# Patient Record
Sex: Female | Born: 1950 | Race: White | Hispanic: No | Marital: Married | Smoking: Never smoker
Health system: Southern US, Community
[De-identification: ages and names within clinical notes are randomized; demographics above are authoritative.]

## PROBLEM LIST (undated history)

## (undated) DIAGNOSIS — Z8719 Personal history of other diseases of the digestive system: Secondary | ICD-10-CM

## (undated) DIAGNOSIS — F419 Anxiety disorder, unspecified: Secondary | ICD-10-CM

## (undated) DIAGNOSIS — F40240 Claustrophobia: Secondary | ICD-10-CM

## (undated) DIAGNOSIS — E039 Hypothyroidism, unspecified: Secondary | ICD-10-CM

## (undated) DIAGNOSIS — D649 Anemia, unspecified: Secondary | ICD-10-CM

## (undated) DIAGNOSIS — I10 Essential (primary) hypertension: Secondary | ICD-10-CM

## (undated) DIAGNOSIS — M1990 Unspecified osteoarthritis, unspecified site: Secondary | ICD-10-CM

## (undated) DIAGNOSIS — C210 Malignant neoplasm of anus, unspecified: Secondary | ICD-10-CM

## (undated) HISTORY — PX: DIAGNOSTIC LAPAROSCOPY: SUR761

## (undated) HISTORY — PX: FRACTURE SURGERY: SHX138

## (undated) HISTORY — PX: TONSILLECTOMY: SUR1361

## (undated) HISTORY — PX: OPEN REDUCTION NASAL FRACTURE: SHX2105

## (undated) HISTORY — DX: Malignant neoplasm of anus, unspecified: C21.0

---

## 2007-08-17 ENCOUNTER — Encounter: Admission: RE | Admit: 2007-08-17 | Discharge: 2007-08-17 | Payer: Self-pay | Admitting: Obstetrics and Gynecology

## 2008-08-28 ENCOUNTER — Encounter: Admission: RE | Admit: 2008-08-28 | Discharge: 2008-08-28 | Payer: Self-pay | Admitting: Obstetrics and Gynecology

## 2010-08-28 ENCOUNTER — Encounter
Admission: RE | Admit: 2010-08-28 | Discharge: 2010-08-28 | Payer: Self-pay | Source: Home / Self Care | Attending: Obstetrics and Gynecology | Admitting: Obstetrics and Gynecology

## 2010-09-20 ENCOUNTER — Encounter: Payer: Self-pay | Admitting: Obstetrics and Gynecology

## 2011-08-20 ENCOUNTER — Other Ambulatory Visit: Payer: Self-pay | Admitting: Obstetrics and Gynecology

## 2011-08-20 DIAGNOSIS — Z1231 Encounter for screening mammogram for malignant neoplasm of breast: Secondary | ICD-10-CM

## 2011-11-22 ENCOUNTER — Ambulatory Visit
Admission: RE | Admit: 2011-11-22 | Discharge: 2011-11-22 | Disposition: A | Discharge status: Home / Self Care | Payer: Medicare Other | Source: Ambulatory Visit | Attending: Obstetrics and Gynecology | Admitting: Obstetrics and Gynecology

## 2011-11-22 DIAGNOSIS — Z1231 Encounter for screening mammogram for malignant neoplasm of breast: Secondary | ICD-10-CM

## 2013-01-10 ENCOUNTER — Other Ambulatory Visit: Payer: Self-pay

## 2013-01-10 DIAGNOSIS — Z1231 Encounter for screening mammogram for malignant neoplasm of breast: Secondary | ICD-10-CM

## 2013-02-14 ENCOUNTER — Ambulatory Visit
Admission: RE | Admit: 2013-02-14 | Discharge: 2013-02-14 | Disposition: A | Discharge status: Home / Self Care | Payer: Medicare Other | Source: Ambulatory Visit

## 2013-02-14 DIAGNOSIS — Z1231 Encounter for screening mammogram for malignant neoplasm of breast: Secondary | ICD-10-CM

## 2014-06-21 ENCOUNTER — Other Ambulatory Visit: Payer: Self-pay

## 2014-06-21 DIAGNOSIS — Z1231 Encounter for screening mammogram for malignant neoplasm of breast: Secondary | ICD-10-CM

## 2014-07-11 ENCOUNTER — Encounter (INDEPENDENT_AMBULATORY_CARE_PROVIDER_SITE_OTHER): Payer: Self-pay

## 2014-07-11 ENCOUNTER — Ambulatory Visit
Admission: RE | Admit: 2014-07-11 | Discharge: 2014-07-11 | Disposition: A | Discharge status: Home / Self Care | Payer: Medicare Other | Source: Ambulatory Visit

## 2014-07-11 DIAGNOSIS — Z1231 Encounter for screening mammogram for malignant neoplasm of breast: Secondary | ICD-10-CM

## 2015-07-16 ENCOUNTER — Other Ambulatory Visit: Payer: Self-pay

## 2015-07-16 DIAGNOSIS — Z1231 Encounter for screening mammogram for malignant neoplasm of breast: Secondary | ICD-10-CM

## 2015-11-03 DIAGNOSIS — M25562 Pain in left knee: Secondary | ICD-10-CM | POA: Diagnosis not present

## 2015-11-14 DIAGNOSIS — Z888 Allergy status to other drugs, medicaments and biological substances status: Secondary | ICD-10-CM | POA: Diagnosis not present

## 2015-11-14 DIAGNOSIS — M25562 Pain in left knee: Secondary | ICD-10-CM | POA: Diagnosis not present

## 2015-11-14 DIAGNOSIS — M7732 Calcaneal spur, left foot: Secondary | ICD-10-CM | POA: Diagnosis not present

## 2015-11-14 DIAGNOSIS — I10 Essential (primary) hypertension: Secondary | ICD-10-CM | POA: Diagnosis not present

## 2015-11-14 DIAGNOSIS — Z79899 Other long term (current) drug therapy: Secondary | ICD-10-CM | POA: Diagnosis not present

## 2015-11-14 DIAGNOSIS — Z7982 Long term (current) use of aspirin: Secondary | ICD-10-CM | POA: Diagnosis not present

## 2015-11-14 DIAGNOSIS — T84194A Other mechanical complication of internal fixation device of right femur, initial encounter: Secondary | ICD-10-CM | POA: Diagnosis not present

## 2015-11-14 DIAGNOSIS — E039 Hypothyroidism, unspecified: Secondary | ICD-10-CM | POA: Diagnosis not present

## 2015-11-14 DIAGNOSIS — Z8249 Family history of ischemic heart disease and other diseases of the circulatory system: Secondary | ICD-10-CM | POA: Diagnosis not present

## 2015-11-14 DIAGNOSIS — Z833 Family history of diabetes mellitus: Secondary | ICD-10-CM | POA: Diagnosis not present

## 2015-11-14 DIAGNOSIS — M1990 Unspecified osteoarthritis, unspecified site: Secondary | ICD-10-CM | POA: Diagnosis not present

## 2015-11-14 DIAGNOSIS — Y838 Other surgical procedures as the cause of abnormal reaction of the patient, or of later complication, without mention of misadventure at the time of the procedure: Secondary | ICD-10-CM | POA: Diagnosis not present

## 2015-11-14 DIAGNOSIS — Z8261 Family history of arthritis: Secondary | ICD-10-CM | POA: Diagnosis not present

## 2015-11-14 DIAGNOSIS — T8484XA Pain due to internal orthopedic prosthetic devices, implants and grafts, initial encounter: Secondary | ICD-10-CM | POA: Diagnosis not present

## 2015-11-17 DIAGNOSIS — I10 Essential (primary) hypertension: Secondary | ICD-10-CM | POA: Diagnosis not present

## 2015-11-17 DIAGNOSIS — M25562 Pain in left knee: Secondary | ICD-10-CM | POA: Diagnosis not present

## 2015-11-17 DIAGNOSIS — Z79899 Other long term (current) drug therapy: Secondary | ICD-10-CM | POA: Diagnosis not present

## 2015-11-17 DIAGNOSIS — T8484XA Pain due to internal orthopedic prosthetic devices, implants and grafts, initial encounter: Secondary | ICD-10-CM | POA: Diagnosis not present

## 2015-11-17 DIAGNOSIS — Z7982 Long term (current) use of aspirin: Secondary | ICD-10-CM | POA: Diagnosis not present

## 2015-11-17 DIAGNOSIS — E039 Hypothyroidism, unspecified: Secondary | ICD-10-CM | POA: Diagnosis not present

## 2015-12-10 DIAGNOSIS — M25562 Pain in left knee: Secondary | ICD-10-CM | POA: Diagnosis not present

## 2015-12-10 DIAGNOSIS — T84194A Other mechanical complication of internal fixation device of right femur, initial encounter: Secondary | ICD-10-CM | POA: Diagnosis not present

## 2015-12-16 DIAGNOSIS — M25562 Pain in left knee: Secondary | ICD-10-CM | POA: Diagnosis not present

## 2015-12-16 DIAGNOSIS — R262 Difficulty in walking, not elsewhere classified: Secondary | ICD-10-CM | POA: Diagnosis not present

## 2015-12-19 DIAGNOSIS — M25562 Pain in left knee: Secondary | ICD-10-CM | POA: Diagnosis not present

## 2015-12-19 DIAGNOSIS — R262 Difficulty in walking, not elsewhere classified: Secondary | ICD-10-CM | POA: Diagnosis not present

## 2015-12-24 DIAGNOSIS — M25562 Pain in left knee: Secondary | ICD-10-CM | POA: Diagnosis not present

## 2015-12-24 DIAGNOSIS — R262 Difficulty in walking, not elsewhere classified: Secondary | ICD-10-CM | POA: Diagnosis not present

## 2015-12-26 DIAGNOSIS — M25562 Pain in left knee: Secondary | ICD-10-CM | POA: Diagnosis not present

## 2015-12-26 DIAGNOSIS — R262 Difficulty in walking, not elsewhere classified: Secondary | ICD-10-CM | POA: Diagnosis not present

## 2015-12-31 DIAGNOSIS — M25562 Pain in left knee: Secondary | ICD-10-CM | POA: Diagnosis not present

## 2015-12-31 DIAGNOSIS — R262 Difficulty in walking, not elsewhere classified: Secondary | ICD-10-CM | POA: Diagnosis not present

## 2016-01-02 DIAGNOSIS — M25562 Pain in left knee: Secondary | ICD-10-CM | POA: Diagnosis not present

## 2016-01-02 DIAGNOSIS — R262 Difficulty in walking, not elsewhere classified: Secondary | ICD-10-CM | POA: Diagnosis not present

## 2016-01-07 DIAGNOSIS — M25562 Pain in left knee: Secondary | ICD-10-CM | POA: Diagnosis not present

## 2016-01-07 DIAGNOSIS — R262 Difficulty in walking, not elsewhere classified: Secondary | ICD-10-CM | POA: Diagnosis not present

## 2016-01-09 DIAGNOSIS — R262 Difficulty in walking, not elsewhere classified: Secondary | ICD-10-CM | POA: Diagnosis not present

## 2016-01-09 DIAGNOSIS — M25562 Pain in left knee: Secondary | ICD-10-CM | POA: Diagnosis not present

## 2016-01-13 DIAGNOSIS — I10 Essential (primary) hypertension: Secondary | ICD-10-CM | POA: Diagnosis not present

## 2016-01-13 DIAGNOSIS — E039 Hypothyroidism, unspecified: Secondary | ICD-10-CM | POA: Diagnosis not present

## 2016-01-13 DIAGNOSIS — Z789 Other specified health status: Secondary | ICD-10-CM | POA: Diagnosis not present

## 2016-01-13 DIAGNOSIS — E785 Hyperlipidemia, unspecified: Secondary | ICD-10-CM | POA: Diagnosis not present

## 2016-01-13 DIAGNOSIS — Z299 Encounter for prophylactic measures, unspecified: Secondary | ICD-10-CM | POA: Diagnosis not present

## 2016-01-14 DIAGNOSIS — E039 Hypothyroidism, unspecified: Secondary | ICD-10-CM | POA: Diagnosis not present

## 2016-01-14 DIAGNOSIS — E785 Hyperlipidemia, unspecified: Secondary | ICD-10-CM | POA: Diagnosis not present

## 2016-01-14 DIAGNOSIS — R262 Difficulty in walking, not elsewhere classified: Secondary | ICD-10-CM | POA: Diagnosis not present

## 2016-01-14 DIAGNOSIS — I10 Essential (primary) hypertension: Secondary | ICD-10-CM | POA: Diagnosis not present

## 2016-01-14 DIAGNOSIS — M25562 Pain in left knee: Secondary | ICD-10-CM | POA: Diagnosis not present

## 2016-01-16 DIAGNOSIS — R262 Difficulty in walking, not elsewhere classified: Secondary | ICD-10-CM | POA: Diagnosis not present

## 2016-01-16 DIAGNOSIS — M25562 Pain in left knee: Secondary | ICD-10-CM | POA: Diagnosis not present

## 2016-01-21 DIAGNOSIS — Z299 Encounter for prophylactic measures, unspecified: Secondary | ICD-10-CM | POA: Diagnosis not present

## 2016-01-21 DIAGNOSIS — I10 Essential (primary) hypertension: Secondary | ICD-10-CM | POA: Diagnosis not present

## 2016-01-21 DIAGNOSIS — E785 Hyperlipidemia, unspecified: Secondary | ICD-10-CM | POA: Diagnosis not present

## 2016-01-21 DIAGNOSIS — E039 Hypothyroidism, unspecified: Secondary | ICD-10-CM | POA: Diagnosis not present

## 2016-01-23 DIAGNOSIS — R262 Difficulty in walking, not elsewhere classified: Secondary | ICD-10-CM | POA: Diagnosis not present

## 2016-01-23 DIAGNOSIS — M25562 Pain in left knee: Secondary | ICD-10-CM | POA: Diagnosis not present

## 2016-02-06 DIAGNOSIS — R262 Difficulty in walking, not elsewhere classified: Secondary | ICD-10-CM | POA: Diagnosis not present

## 2016-02-06 DIAGNOSIS — M25562 Pain in left knee: Secondary | ICD-10-CM | POA: Diagnosis not present

## 2016-02-11 DIAGNOSIS — M25562 Pain in left knee: Secondary | ICD-10-CM | POA: Diagnosis not present

## 2016-02-11 DIAGNOSIS — R262 Difficulty in walking, not elsewhere classified: Secondary | ICD-10-CM | POA: Diagnosis not present

## 2016-02-13 DIAGNOSIS — M25562 Pain in left knee: Secondary | ICD-10-CM | POA: Diagnosis not present

## 2016-02-13 DIAGNOSIS — R262 Difficulty in walking, not elsewhere classified: Secondary | ICD-10-CM | POA: Diagnosis not present

## 2016-02-18 DIAGNOSIS — R262 Difficulty in walking, not elsewhere classified: Secondary | ICD-10-CM | POA: Diagnosis not present

## 2016-02-18 DIAGNOSIS — M25562 Pain in left knee: Secondary | ICD-10-CM | POA: Diagnosis not present

## 2016-02-20 DIAGNOSIS — R262 Difficulty in walking, not elsewhere classified: Secondary | ICD-10-CM | POA: Diagnosis not present

## 2016-02-20 DIAGNOSIS — M25562 Pain in left knee: Secondary | ICD-10-CM | POA: Diagnosis not present

## 2016-04-01 DIAGNOSIS — N952 Postmenopausal atrophic vaginitis: Secondary | ICD-10-CM | POA: Diagnosis not present

## 2016-04-01 DIAGNOSIS — Z1212 Encounter for screening for malignant neoplasm of rectum: Secondary | ICD-10-CM | POA: Diagnosis not present

## 2016-04-01 DIAGNOSIS — K648 Other hemorrhoids: Secondary | ICD-10-CM | POA: Diagnosis not present

## 2016-05-24 DIAGNOSIS — M172 Bilateral post-traumatic osteoarthritis of knee: Secondary | ICD-10-CM | POA: Diagnosis not present

## 2016-05-24 DIAGNOSIS — M1712 Unilateral primary osteoarthritis, left knee: Secondary | ICD-10-CM | POA: Diagnosis not present

## 2016-05-24 DIAGNOSIS — M25561 Pain in right knee: Secondary | ICD-10-CM | POA: Diagnosis not present

## 2016-05-24 DIAGNOSIS — M25562 Pain in left knee: Secondary | ICD-10-CM | POA: Diagnosis not present

## 2016-06-03 DIAGNOSIS — E7800 Pure hypercholesterolemia, unspecified: Secondary | ICD-10-CM | POA: Diagnosis not present

## 2016-06-03 DIAGNOSIS — Z1211 Encounter for screening for malignant neoplasm of colon: Secondary | ICD-10-CM | POA: Diagnosis not present

## 2016-06-03 DIAGNOSIS — Z7189 Other specified counseling: Secondary | ICD-10-CM | POA: Diagnosis not present

## 2016-06-03 DIAGNOSIS — Z299 Encounter for prophylactic measures, unspecified: Secondary | ICD-10-CM | POA: Diagnosis not present

## 2016-06-03 DIAGNOSIS — Z0000 Encounter for general adult medical examination without abnormal findings: Secondary | ICD-10-CM | POA: Diagnosis not present

## 2016-06-03 DIAGNOSIS — E039 Hypothyroidism, unspecified: Secondary | ICD-10-CM | POA: Diagnosis not present

## 2016-06-03 DIAGNOSIS — Z1389 Encounter for screening for other disorder: Secondary | ICD-10-CM | POA: Diagnosis not present

## 2016-06-03 DIAGNOSIS — Z79899 Other long term (current) drug therapy: Secondary | ICD-10-CM | POA: Diagnosis not present

## 2016-06-03 DIAGNOSIS — R5383 Other fatigue: Secondary | ICD-10-CM | POA: Diagnosis not present

## 2016-06-17 DIAGNOSIS — M172 Bilateral post-traumatic osteoarthritis of knee: Secondary | ICD-10-CM | POA: Diagnosis not present

## 2016-06-18 ENCOUNTER — Other Ambulatory Visit: Payer: Self-pay | Admitting: Obstetrics and Gynecology

## 2016-06-18 DIAGNOSIS — Z1231 Encounter for screening mammogram for malignant neoplasm of breast: Secondary | ICD-10-CM

## 2016-07-07 ENCOUNTER — Ambulatory Visit: Payer: Self-pay

## 2016-07-13 ENCOUNTER — Ambulatory Visit
Admission: RE | Admit: 2016-07-13 | Discharge: 2016-07-13 | Disposition: A | Discharge status: Home / Self Care | Payer: Medicare Other | Source: Ambulatory Visit | Attending: Obstetrics and Gynecology | Admitting: Obstetrics and Gynecology

## 2016-07-13 DIAGNOSIS — Z1231 Encounter for screening mammogram for malignant neoplasm of breast: Secondary | ICD-10-CM

## 2016-08-18 DIAGNOSIS — L821 Other seborrheic keratosis: Secondary | ICD-10-CM | POA: Diagnosis not present

## 2016-08-18 DIAGNOSIS — L7211 Pilar cyst: Secondary | ICD-10-CM | POA: Diagnosis not present

## 2016-08-18 DIAGNOSIS — L820 Inflamed seborrheic keratosis: Secondary | ICD-10-CM | POA: Diagnosis not present

## 2016-08-18 DIAGNOSIS — L818 Other specified disorders of pigmentation: Secondary | ICD-10-CM | POA: Diagnosis not present

## 2016-09-07 DIAGNOSIS — E039 Hypothyroidism, unspecified: Secondary | ICD-10-CM | POA: Diagnosis not present

## 2016-09-17 DIAGNOSIS — M1711 Unilateral primary osteoarthritis, right knee: Secondary | ICD-10-CM | POA: Diagnosis not present

## 2016-09-17 DIAGNOSIS — M170 Bilateral primary osteoarthritis of knee: Secondary | ICD-10-CM | POA: Diagnosis not present

## 2016-09-17 DIAGNOSIS — M1712 Unilateral primary osteoarthritis, left knee: Secondary | ICD-10-CM | POA: Diagnosis not present

## 2016-11-04 DIAGNOSIS — Z789 Other specified health status: Secondary | ICD-10-CM | POA: Diagnosis not present

## 2016-11-04 DIAGNOSIS — E785 Hyperlipidemia, unspecified: Secondary | ICD-10-CM | POA: Diagnosis not present

## 2016-11-04 DIAGNOSIS — Z299 Encounter for prophylactic measures, unspecified: Secondary | ICD-10-CM | POA: Diagnosis not present

## 2016-11-04 DIAGNOSIS — Z713 Dietary counseling and surveillance: Secondary | ICD-10-CM | POA: Diagnosis not present

## 2016-11-04 DIAGNOSIS — E039 Hypothyroidism, unspecified: Secondary | ICD-10-CM | POA: Diagnosis not present

## 2016-11-04 DIAGNOSIS — Z6834 Body mass index (BMI) 34.0-34.9, adult: Secondary | ICD-10-CM | POA: Diagnosis not present

## 2016-11-04 DIAGNOSIS — R109 Unspecified abdominal pain: Secondary | ICD-10-CM | POA: Diagnosis not present

## 2017-01-07 DIAGNOSIS — Z789 Other specified health status: Secondary | ICD-10-CM | POA: Diagnosis not present

## 2017-01-07 DIAGNOSIS — R1011 Right upper quadrant pain: Secondary | ICD-10-CM | POA: Diagnosis not present

## 2017-01-07 DIAGNOSIS — Z299 Encounter for prophylactic measures, unspecified: Secondary | ICD-10-CM | POA: Diagnosis not present

## 2017-01-07 DIAGNOSIS — Z6834 Body mass index (BMI) 34.0-34.9, adult: Secondary | ICD-10-CM | POA: Diagnosis not present

## 2017-01-07 DIAGNOSIS — E039 Hypothyroidism, unspecified: Secondary | ICD-10-CM | POA: Diagnosis not present

## 2017-01-07 DIAGNOSIS — J329 Chronic sinusitis, unspecified: Secondary | ICD-10-CM | POA: Diagnosis not present

## 2017-01-10 NOTE — Progress Notes (Signed)
Please place orders in EPIC as patient is being scheduled for a pre-op appointment! Thank you! 

## 2017-01-14 ENCOUNTER — Ambulatory Visit: Payer: Self-pay | Admitting: Orthopedic Surgery

## 2017-01-17 DIAGNOSIS — K8020 Calculus of gallbladder without cholecystitis without obstruction: Secondary | ICD-10-CM | POA: Diagnosis not present

## 2017-01-17 DIAGNOSIS — R1010 Upper abdominal pain, unspecified: Secondary | ICD-10-CM | POA: Diagnosis not present

## 2017-01-21 DIAGNOSIS — Z6833 Body mass index (BMI) 33.0-33.9, adult: Secondary | ICD-10-CM | POA: Diagnosis not present

## 2017-01-21 DIAGNOSIS — Z299 Encounter for prophylactic measures, unspecified: Secondary | ICD-10-CM | POA: Diagnosis not present

## 2017-01-21 DIAGNOSIS — E039 Hypothyroidism, unspecified: Secondary | ICD-10-CM | POA: Diagnosis not present

## 2017-01-21 DIAGNOSIS — I10 Essential (primary) hypertension: Secondary | ICD-10-CM | POA: Diagnosis not present

## 2017-01-21 DIAGNOSIS — R109 Unspecified abdominal pain: Secondary | ICD-10-CM | POA: Diagnosis not present

## 2017-01-21 DIAGNOSIS — E785 Hyperlipidemia, unspecified: Secondary | ICD-10-CM | POA: Diagnosis not present

## 2017-02-01 ENCOUNTER — Encounter: Payer: Self-pay | Admitting: General Surgery

## 2017-02-01 ENCOUNTER — Ambulatory Visit (INDEPENDENT_AMBULATORY_CARE_PROVIDER_SITE_OTHER): Payer: Medicare Other | Admitting: General Surgery

## 2017-02-01 VITALS — BP 162/81 | HR 81 | Temp 97.8°F | Resp 18 | Ht 64.0 in | Wt 194.0 lb

## 2017-02-01 DIAGNOSIS — K8020 Calculus of gallbladder without cholecystitis without obstruction: Secondary | ICD-10-CM | POA: Diagnosis not present

## 2017-02-01 NOTE — Patient Instructions (Signed)
[name]  02/01/2017     @PREFPERIOPPHARMACY @   Your procedure is scheduled on  02/04/2017   Report to [facility] at  615  A.M.  Call this number if you have problems the morning of surgery:  [phone]   Remember:  Do not eat food or drink liquids after midnight.  Take these medicines the morning of surgery with A SIP OF WATER  None   Do not wear jewelry, make-up or nail polish.  Do not wear lotions, powders, or perfumes, or deoderant.  Do not shave 48 hours prior to surgery.  Men may shave face and neck.  Do not bring valuables to the hospital.  [facility] is not responsible for any belongings or valuables.  Contacts, dentures or bridgework may not be worn into surgery.  Leave your suitcase in the car.  After surgery it may be brought to your room.  For patients admitted to the hospital, discharge time will be determined by your treatment team.  Patients discharged the day of surgery will not be allowed to drive home.   Name and phone number of your driver:   family Special instructions:  None  Please read over the following fact sheets that you were given. Anesthesia Post-op Instructions and Care and Recovery After Surgery       Laparoscopic Cholecystectomy Laparoscopic cholecystectomy is surgery to remove the gallbladder. The gallbladder is a pear-shaped organ that lies beneath the liver on the right side of the body. The gallbladder stores bile, which is a fluid that helps the body to digest fats. Cholecystectomy is often done for inflammation of the gallbladder (cholecystitis). This condition is usually caused by a buildup of gallstones (cholelithiasis) in the gallbladder. Gallstones can block the flow of bile, which can result in inflammation and pain. In severe cases, emergency surgery may be required. This procedure is done though small incisions in your abdomen (laparoscopic surgery). A thin scope with a camera (laparoscope) is inserted  through one incision. Thin surgical instruments are inserted through the other incisions. In some cases, a laparoscopic procedure may be turned into a type of surgery that is done through a larger incision (open surgery). Tell a health care provider about:  Any allergies you have.  All medicines you are taking, including vitamins, herbs, eye drops, creams, and over-the-counter medicines.  Any problems you or family members have had with anesthetic medicines.  Any blood disorders you have.  Any surgeries you have had.  Any medical conditions you have.  Whether you are pregnant or may be pregnant. What are the risks? Generally, this is a safe procedure. However, problems may occur, including:  Infection.  Bleeding.  Allergic reactions to medicines.  Damage to other structures or organs.  A stone remaining in the common bile duct. The common bile duct carries bile from the gallbladder into the small intestine.  A bile leak from the cyst duct that is clipped when your gallbladder is removed.  What happens before the procedure? Staying hydrated Follow instructions from your health care provider about hydration, which may include:  Up to 2 hours before the procedure - you may continue to drink clear liquids, such as water, clear fruit juice, black coffee, and plain tea.  Eating and drinking restrictions Follow instructions from your health care provider about eating and drinking, which may include:  8 hours before the procedure - stop eating heavy meals or foods such as  meat, fried foods, or fatty foods.  6 hours before the procedure - stop eating light meals or foods, such as toast or cereal.  6 hours before the procedure - stop drinking milk or drinks that contain milk.  2 hours before the procedure - stop drinking clear liquids.  Medicines  Ask your health care provider about: ? Changing or stopping your regular medicines. This is especially important if you are taking  diabetes medicines or blood thinners. ? Taking medicines such as aspirin and ibuprofen. These medicines can thin your blood. Do not take these medicines before your procedure if your health care provider instructs you not to.  You may be given antibiotic medicine to help prevent infection. General instructions  Let your health care provider know if you develop a cold or an infection before surgery.  Plan to have someone take you home from the hospital or clinic.  Ask your health care provider how your surgical site will be marked or identified. What happens during the procedure?  To reduce your risk of infection: ? Your health care team will wash or sanitize their hands. ? Your skin will be washed with soap. ? Hair may be removed from the surgical area.  An IV tube may be inserted into one of your veins.  You will be given one or more of the following: ? A medicine to help you relax (sedative). ? A medicine to make you fall asleep (general anesthetic).  A breathing tube will be placed in your mouth.  Your surgeon will make several small cuts (incisions) in your abdomen.  The laparoscope will be inserted through one of the small incisions. The camera on the laparoscope will send images to a TV screen (monitor) in the operating room. This lets your surgeon see inside your abdomen.  Air-like gas will be pumped into your abdomen. This will expand your abdomen to give the surgeon more room to perform the surgery.  Other tools that are needed for the procedure will be inserted through the other incisions. The gallbladder will be removed through one of the incisions.  Your common bile duct may be examined. If stones are found in the common bile duct, they may be removed.  After your gallbladder has been removed, the incisions will be closed with stitches (sutures), staples, or skin glue.  Your incisions may be covered with a bandage (dressing). The procedure may vary among health care  providers and hospitals. What happens after the procedure?  Your blood pressure, heart rate, breathing rate, and blood oxygen level will be monitored until the medicines you were given have worn off.  You will be given medicines as needed to control your pain.  Do not drive for 24 hours if you were given a sedative. This information is not intended to replace advice given to you by your health care provider. Make sure you discuss any questions you have with your health care provider. Document Released: 08/16/2005 Document Revised: 03/07/2016 Document Reviewed: 02/02/2016 Elsevier Interactive Patient Education  2018 [employer].  Laparoscopic Cholecystectomy, Care After This sheet gives you information about how to care for yourself after your procedure. Your doctor may also give you more specific instructions. If you have problems or questions, contact your doctor. Follow these instructions at home: Care for cuts from surgery (incisions)   Follow instructions from your doctor about how to take care of your cuts from surgery. Make sure you: ? Wash your hands with soap and water before you change your  bandage (dressing). If you cannot use soap and water, use hand sanitizer. ? Change your bandage as told by your doctor. ? Leave stitches (sutures), skin glue, or skin tape (adhesive) strips in place. They may need to stay in place for 2 weeks or longer. If tape strips get loose and curl up, you may trim the loose edges. Do not remove tape strips completely unless your doctor says it is okay.  Do not take baths, swim, or use a hot tub until your doctor says it is okay. Ask your doctor if you can take showers. You may only be allowed to take sponge baths for bathing.  Check your surgical cut area every day for signs of infection. Check for: ? More redness, swelling, or pain. ? More fluid or blood. ? Warmth. ? Pus or a bad smell. Activity  Do not drive or use heavy machinery while taking  prescription pain medicine.  Do not lift anything that is heavier than 10 lb (4.5 kg) until your doctor says it is okay.  Do not play contact sports until your doctor says it is okay.  Do not drive for 24 hours if you were given a medicine to help you relax (sedative).  Rest as needed. Do not return to work or school until your doctor says it is okay. General instructions  Take over-the-counter and prescription medicines only as told by your doctor.  To prevent or treat constipation while you are taking prescription pain medicine, your doctor may recommend that you: ? Drink enough fluid to keep your pee (urine) clear or pale yellow. ? Take over-the-counter or prescription medicines. ? Eat foods that are high in fiber, such as fresh fruits and vegetables, whole grains, and beans. ? Limit foods that are high in fat and processed sugars, such as fried and sweet foods. Contact a doctor if:  You develop a rash.  You have more redness, swelling, or pain around your surgical cuts.  You have more fluid or blood coming from your surgical cuts.  Your surgical cuts feel warm to the touch.  You have pus or a bad smell coming from your surgical cuts.  You have a fever.  One or more of your surgical cuts breaks open. Get help right away if:  You have trouble breathing.  You have chest pain.  You have pain that is getting worse in your shoulders.  You faint or feel dizzy when you stand.  You have very bad pain in your belly (abdomen).  You are sick to your stomach (nauseous) for more than one day.  You have throwing up (vomiting) that lasts for more than one day.  You have leg pain. This information is not intended to replace advice given to you by your health care provider. Make sure you discuss any questions you have with your health care provider. Document Released: 05/25/2008 Document Revised: 03/06/2016 Document Reviewed: 02/02/2016 Elsevier Interactive Patient Education   2017 [place] Anesthesia, Adult General anesthesia is the use of medicines to make a person "go to sleep" (be unconscious) for a medical procedure. General anesthesia is often recommended when a procedure:  Is long.  Requires you to be still or in an unusual position.  Is major and can cause you to lose blood.  Is impossible to do without general anesthesia.  The medicines used for general anesthesia are called general anesthetics. In addition to making you sleep, the medicines:  Prevent pain.  Control your blood pressure.  Relax  your muscles.  Tell a health care provider about:  Any allergies you have.  All medicines you are taking, including vitamins, herbs, eye drops, creams, and over-the-counter medicines.  Any problems you or family members have had with anesthetic medicines.  Types of anesthetics you have had in the past.  Any bleeding disorders you have.  Any surgeries you have had.  Any medical conditions you have.  Any history of heart or lung conditions, such as heart failure, sleep apnea, or chronic obstructive pulmonary disease (COPD).  Whether you are pregnant or may be pregnant.  Whether you use tobacco, alcohol, marijuana, or street drugs.  Any history of Armed forces logistics/support/administrative officer.  Any history of depression or anxiety. What are the risks? Generally, this is a safe procedure. However, problems may occur, including:  Allergic reaction to anesthetics.  Lung and heart problems.  Inhaling food or liquids from your stomach into your lungs (aspiration).  Injury to nerves.  Waking up during your procedure and being unable to move (rare).  Extreme agitation or a state of mental confusion (delirium) when you wake up from the anesthetic.  Air in the bloodstream, which can lead to stroke.  These problems are more likely to develop if you are having a major surgery or if you have an advanced medical condition. You can prevent some of these  complications by answering all of your health care provider's questions thoroughly and by following all pre-procedure instructions. General anesthesia can cause side effects, including:  Nausea or vomiting  A sore throat from the breathing tube.  Feeling cold or shivery.  Feeling tired, washed out, or achy.  Sleepiness or drowsiness.  Confusion or agitation.  What happens before the procedure? Staying hydrated Follow instructions from your health care provider about hydration, which may include:  Up to 2 hours before the procedure - you may continue to drink clear liquids, such as water, clear fruit juice, black coffee, and plain tea.  Eating and drinking restrictions Follow instructions from your health care provider about eating and drinking, which may include:  8 hours before the procedure - stop eating heavy meals or foods such as meat, fried foods, or fatty foods.  6 hours before the procedure - stop eating light meals or foods, such as toast or cereal.  6 hours before the procedure - stop drinking milk or drinks that contain milk.  2 hours before the procedure - stop drinking clear liquids.  Medicines  Ask your health care provider about: ? Changing or stopping your regular medicines. This is especially important if you are taking diabetes medicines or blood thinners. ? Taking medicines such as aspirin and ibuprofen. These medicines can thin your blood. Do not take these medicines before your procedure if your health care provider instructs you not to. ? Taking new dietary supplements or medicines. Do not take these during the week before your procedure unless your health care provider approves them.  If you are told to take a medicine or to continue taking a medicine on the day of the procedure, take the medicine with sips of water. General instructions   Ask if you will be going home the same day, the following day, or after a longer hospital stay. ? Plan to have  someone take you home. ? Plan to have someone stay with you for the first 24 hours after you leave the hospital or clinic.  For 3-6 weeks before the procedure, try not to use any tobacco products, such as cigarettes, chewing  tobacco, and e-cigarettes.  You may brush your teeth on the morning of the procedure, but make sure to spit out the toothpaste. What happens during the procedure?  You will be given anesthetics through a mask and through an IV tube in one of your veins.  You may receive medicine to help you relax (sedative).  As soon as you are asleep, a breathing tube may be used to help you breathe.  An anesthesia specialist will stay with you throughout the procedure. He or she will help keep you comfortable and safe by continuing to give you medicines and adjusting the amount of medicine that you get. He or she will also watch your blood pressure, pulse, and oxygen levels to make sure that the anesthetics do not cause any problems.  If a breathing tube was used to help you breathe, it will be removed before you wake up. The procedure may vary among health care providers and hospitals. What happens after the procedure?  You will wake up, often slowly, after the procedure is complete, usually in a recovery area.  Your blood pressure, heart rate, breathing rate, and blood oxygen level will be monitored until the medicines you were given have worn off.  You may be given medicine to help you calm down if you feel anxious or agitated.  If you will be going home the same day, your health care provider may check to make sure you can stand, drink, and urinate.  Your health care providers will treat your pain and side effects before you go home.  Do not drive for 24 hours if you received a sedative.  You may: ? Feel nauseous and vomit. ? Have a sore throat. ? Have mental slowness. ? Feel cold or shivery. ? Feel sleepy. ? Feel tired. ? Feel sore or achy, even in parts of your body  where you did not have surgery. This information is not intended to replace advice given to you by your health care provider. Make sure you discuss any questions you have with your health care provider. Document Released: 11/23/2007 Document Revised: 01/27/2016 Document Reviewed: 07/31/2015 Elsevier Interactive Patient Education  2018 [place] Anesthesia, Adult, Care After These instructions provide you with information about caring for yourself after your procedure. Your health care provider may also give you more specific instructions. Your treatment has been planned according to current medical practices, but problems sometimes occur. Call your health care provider if you have any problems or questions after your procedure. What can I expect after the procedure? After the procedure, it is common to have:  Vomiting.  A sore throat.  Mental slowness.  It is common to feel:  Nauseous.  Cold or shivery.  Sleepy.  Tired.  Sore or achy, even in parts of your body where you did not have surgery.  Follow these instructions at home: For at least 24 hours after the procedure:  Do not: ? Participate in activities where you could fall or become injured. ? Drive. ? Use heavy machinery. ? Drink alcohol. ? Take sleeping pills or medicines that cause drowsiness. ? Make important decisions or sign legal documents. ? Take care of children on your own.  Rest. Eating and drinking  If you vomit, drink water, juice, or soup when you can drink without vomiting.  Drink enough fluid to keep your urine clear or pale yellow.  Make sure you have little or no nausea before eating solid foods.  Follow the diet recommended by your health  care provider. General instructions  Have a responsible adult stay with you until you are awake and alert.  Return to your normal activities as told by your health care provider. Ask your health care provider what activities are safe for  you.  Take over-the-counter and prescription medicines only as told by your health care provider.  If you smoke, do not smoke without supervision.  Keep all follow-up visits as told by your health care provider. This is important. Contact a health care provider if:  You continue to have nausea or vomiting at home, and medicines are not helpful.  You cannot drink fluids or start eating again.  You cannot urinate after 8-12 hours.  You develop a skin rash.  You have fever.  You have increasing redness at the site of your procedure. Get help right away if:  You have difficulty breathing.  You have chest pain.  You have unexpected bleeding.  You feel that you are having a life-threatening or urgent problem. This information is not intended to replace advice given to you by your health care provider. Make sure you discuss any questions you have with your health care provider. Document Released: 11/22/2000 Document Revised: 01/19/2016 Document Reviewed: 07/31/2015 Elsevier Interactive Patient Education  [employer].

## 2017-02-01 NOTE — Progress Notes (Signed)
[name]; [id number]; 01/20/1951   HPI Patient is a 66 year old white female who presents with worsening right upper quadrant abdominal pain and nausea. She also has had fatty food intolerance. I had seen her in the remote past for cholelithiasis, but she was asymptomatic at that time. She recently had an ultrasound at another facility which revealed a single gallstone near the neck of the gallbladder. The common bile duct within normal limits. The patient referred herself back to my care. She is about to undergo hardware removal in her leg later this month. She states her pain is 8 out of 10. She has been having the pain more frequently and with increased intensity. She is avoiding fatty foods. No fever, chills, jaundice have been noted. History reviewed. No pertinent past medical history.  History reviewed. No pertinent surgical history. Left leg surgery History reviewed. No pertinent family history.  No current outpatient prescriptions on file prior to visit.   No current facility-administered medications on file prior to visit.     No Known Allergies  History  Alcohol use Not on file    History  Smoking Status  . Never Smoker  Smokeless Tobacco  . Never Used    Review of Systems  Constitutional: Positive for malaise/fatigue.  HENT: Positive for ear pain, sinus pain and sore throat.   Eyes: Positive for blurred vision.  Respiratory: Negative.   Cardiovascular: Negative.   Gastrointestinal: Positive for abdominal pain and nausea. Negative for vomiting.  Genitourinary: Negative.   Musculoskeletal: Positive for back pain, joint pain and neck pain.  Skin: Negative.   Neurological: Positive for headaches.  Endo/Heme/Allergies: Negative.   Psychiatric/Behavioral: Negative.     Objective   Vitals:   02/01/17 1126  BP: (!) 162/81  Pulse: 81  Resp: 18  Temp: 97.8 F (36.6 C)    Physical Exam  Constitutional: She is oriented to person, place, and time and  well-developed, well-nourished, and in no distress.  HENT:  Head: Normocephalic and atraumatic.  Neck: Normal range of motion. Neck supple.  Cardiovascular: Normal rate, regular rhythm and normal heart sounds.   No murmur heard. Pulmonary/Chest: Effort normal and breath sounds normal. She has no wheezes. She has no rales.  Abdominal: Soft. She exhibits no distension. There is no rebound.  Slightly tender to palpation in the right upper quadrant. No rigidity noted.  Neurological: She is alert and oriented to person, place, and time.  Skin: Skin is warm and dry.  Vitals reviewed.  Ultrasound report reviewed Assessment  Biliary colic, cholelithiasis Plan   Patient is scheduled for laparoscopic cholecystectomy on 02/04/2017. The risks and benefits of the procedure including bleeding, infection, hepatobiliary injury, the possibility of an open procedure were fully explained to the patient, who gave informed consent.

## 2017-02-01 NOTE — Patient Instructions (Signed)

## 2017-02-01 NOTE — H&P (Signed)
[name]; [id number]; 1951-02-04   HPI Patient is a 67 year old white female who presents with worsening right upper quadrant abdominal pain and nausea. She also has had fatty food intolerance. I had seen her in the remote past for cholelithiasis, but she was asymptomatic at that time. She recently had an ultrasound at another facility which revealed a single gallstone near the neck of the gallbladder. The common bile duct within normal limits. The patient referred herself back to my care. She is about to undergo hardware removal in her leg later this month. She states her pain is 8 out of 10. She has been having the pain more frequently and with increased intensity. She is avoiding fatty foods. No fever, chills, jaundice have been noted. History reviewed. No pertinent past medical history.  History reviewed. No pertinent surgical history. Left leg surgery History reviewed. No pertinent family history.  No current outpatient prescriptions on file prior to visit.   No current facility-administered medications on file prior to visit.     No Known Allergies     History  Alcohol use Not on file       History  Smoking Status  . Never Smoker  Smokeless Tobacco  . Never Used    Review of Systems  Constitutional: Positive for malaise/fatigue.  HENT: Positive for ear pain, sinus pain and sore throat.   Eyes: Positive for blurred vision.  Respiratory: Negative.   Cardiovascular: Negative.   Gastrointestinal: Positive for abdominal pain and nausea. Negative for vomiting.  Genitourinary: Negative.   Musculoskeletal: Positive for back pain, joint pain and neck pain.  Skin: Negative.   Neurological: Positive for headaches.  Endo/Heme/Allergies: Negative.   Psychiatric/Behavioral: Negative.     Objective      Vitals:   02/01/17 1126  BP: (!) 162/81  Pulse: 81  Resp: 18  Temp: 97.8 F (36.6 C)    Physical Exam  Constitutional: She is oriented to  person, place, and time and well-developed, well-nourished, and in no distress.  HENT:  Head: Normocephalic and atraumatic.  Neck: Normal range of motion. Neck supple.  Cardiovascular: Normal rate, regular rhythm and normal heart sounds.   No murmur heard. Pulmonary/Chest: Effort normal and breath sounds normal. She has no wheezes. She has no rales.  Abdominal: Soft. She exhibits no distension. There is no rebound.  Slightly tender to palpation in the right upper quadrant. No rigidity noted.  Neurological: She is alert and oriented to person, place, and time.  Skin: Skin is warm and dry.  Vitals reviewed.  Ultrasound report reviewed Assessment  Biliary colic, cholelithiasis Plan   Patient is scheduled for laparoscopic cholecystectomy on 02/04/2017. The risks and benefits of the procedure including bleeding, infection, hepatobiliary injury, the possibility of an open procedure were fully explained to the patient, who gave informed consent.

## 2017-02-02 ENCOUNTER — Other Ambulatory Visit: Payer: Self-pay

## 2017-02-02 ENCOUNTER — Encounter (HOSPITAL_COMMUNITY)
Admission: RE | Admit: 2017-02-02 | Discharge: 2017-02-02 | Disposition: A | Discharge status: Home / Self Care | Payer: Medicare Other | Source: Ambulatory Visit | Attending: General Surgery | Admitting: General Surgery

## 2017-02-02 ENCOUNTER — Encounter (HOSPITAL_COMMUNITY): Payer: Self-pay

## 2017-02-02 DIAGNOSIS — E039 Hypothyroidism, unspecified: Secondary | ICD-10-CM | POA: Diagnosis not present

## 2017-02-02 DIAGNOSIS — Z79899 Other long term (current) drug therapy: Secondary | ICD-10-CM | POA: Diagnosis not present

## 2017-02-02 DIAGNOSIS — F419 Anxiety disorder, unspecified: Secondary | ICD-10-CM | POA: Diagnosis not present

## 2017-02-02 DIAGNOSIS — K8010 Calculus of gallbladder with chronic cholecystitis without obstruction: Secondary | ICD-10-CM | POA: Diagnosis not present

## 2017-02-02 DIAGNOSIS — I10 Essential (primary) hypertension: Secondary | ICD-10-CM | POA: Diagnosis not present

## 2017-02-02 HISTORY — DX: Claustrophobia: F40.240

## 2017-02-02 HISTORY — DX: Unspecified osteoarthritis, unspecified site: M19.90

## 2017-02-02 HISTORY — DX: Hypothyroidism, unspecified: E03.9

## 2017-02-02 HISTORY — DX: Anxiety disorder, unspecified: F41.9

## 2017-02-02 LAB — COMPREHENSIVE METABOLIC PANEL WITH GFR
ALT: 18 U/L (ref 14–54)
AST: 23 U/L (ref 15–41)
Albumin: 4.2 g/dL (ref 3.5–5.0)
Alkaline Phosphatase: 73 U/L (ref 38–126)
Anion gap: 8 (ref 5–15)
BUN: 15 mg/dL (ref 6–20)
CO2: 28 mmol/L (ref 22–32)
Calcium: 9.1 mg/dL (ref 8.9–10.3)
Chloride: 98 mmol/L — ABNORMAL LOW (ref 101–111)
Creatinine, Ser: 0.9 mg/dL (ref 0.44–1.00)
GFR calc Af Amer: >60 mL/min
GFR calc non Af Amer: >60 mL/min
Glucose, Bld: 95 mg/dL (ref 65–99)
Potassium: 3.9 mmol/L (ref 3.5–5.1)
Sodium: 134 mmol/L — ABNORMAL LOW (ref 135–145)
Total Bilirubin: 0.7 mg/dL (ref 0.3–1.2)
Total Protein: 7.1 g/dL (ref 6.5–8.1)

## 2017-02-02 LAB — CBC WITH DIFFERENTIAL/PLATELET
Basophils Absolute: 0 10*3/uL (ref 0.0–0.1)
Basophils Relative: 0 %
Eosinophils Absolute: 0.2 10*3/uL (ref 0.0–0.7)
Eosinophils Relative: 2 %
HCT: 38.3 % (ref 36.0–46.0)
Hemoglobin: 12.7 g/dL (ref 12.0–15.0)
Lymphocytes Relative: 22 %
Lymphs Abs: 2.1 10*3/uL (ref 0.7–4.0)
MCH: 31.8 pg (ref 26.0–34.0)
MCHC: 33.2 g/dL (ref 30.0–36.0)
MCV: 96 fL (ref 78.0–100.0)
Monocytes Absolute: 0.8 10*3/uL (ref 0.1–1.0)
Monocytes Relative: 8 %
Neutro Abs: 6.4 10*3/uL (ref 1.7–7.7)
Neutrophils Relative %: 68 %
Platelets: 329 10*3/uL (ref 150–400)
RBC: 3.99 MIL/uL (ref 3.87–5.11)
RDW: 12.8 % (ref 11.5–15.5)
WBC: 9.5 10*3/uL (ref 4.0–10.5)

## 2017-02-04 ENCOUNTER — Ambulatory Visit (HOSPITAL_COMMUNITY): Payer: Medicare Other | Admitting: Anesthesiology

## 2017-02-04 ENCOUNTER — Ambulatory Visit (HOSPITAL_COMMUNITY)
Admission: RE | Admit: 2017-02-04 | Discharge: 2017-02-04 | Disposition: A | Discharge status: Home / Self Care | Payer: Medicare Other | Source: Ambulatory Visit | Attending: General Surgery | Admitting: General Surgery

## 2017-02-04 ENCOUNTER — Encounter (HOSPITAL_COMMUNITY)
Admission: RE | Disposition: A | Discharge status: Home / Self Care | Payer: Self-pay | Source: Ambulatory Visit | Attending: General Surgery

## 2017-02-04 ENCOUNTER — Encounter (HOSPITAL_COMMUNITY): Payer: Self-pay | Admitting: *Deleted

## 2017-02-04 DIAGNOSIS — I10 Essential (primary) hypertension: Secondary | ICD-10-CM | POA: Insufficient documentation

## 2017-02-04 DIAGNOSIS — K8010 Calculus of gallbladder with chronic cholecystitis without obstruction: Secondary | ICD-10-CM

## 2017-02-04 DIAGNOSIS — K8000 Calculus of gallbladder with acute cholecystitis without obstruction: Secondary | ICD-10-CM

## 2017-02-04 DIAGNOSIS — Z79899 Other long term (current) drug therapy: Secondary | ICD-10-CM | POA: Insufficient documentation

## 2017-02-04 DIAGNOSIS — E039 Hypothyroidism, unspecified: Secondary | ICD-10-CM | POA: Diagnosis not present

## 2017-02-04 DIAGNOSIS — K8020 Calculus of gallbladder without cholecystitis without obstruction: Secondary | ICD-10-CM | POA: Diagnosis not present

## 2017-02-04 DIAGNOSIS — K828 Other specified diseases of gallbladder: Secondary | ICD-10-CM | POA: Diagnosis not present

## 2017-02-04 DIAGNOSIS — F419 Anxiety disorder, unspecified: Secondary | ICD-10-CM | POA: Insufficient documentation

## 2017-02-04 HISTORY — PX: CHOLECYSTECTOMY: SHX55

## 2017-02-04 SURGERY — LAPAROSCOPIC CHOLECYSTECTOMY
Anesthesia: General | Site: Abdomen

## 2017-02-04 MED ORDER — HYDROCODONE-ACETAMINOPHEN 5-325 MG PO TABS: 1.0000 | ORAL_TABLET | Freq: Four times a day (QID) | ORAL | 0 refills | Status: DC | PRN

## 2017-02-04 MED ORDER — POVIDONE-IODINE 10 % EX OINT
TOPICAL_OINTMENT | CUTANEOUS | Status: AC
  Filled 2017-02-04: qty 1

## 2017-02-04 MED ORDER — BUPIVACAINE HCL (PF) 0.5 % IJ SOLN
INTRAMUSCULAR | Status: AC
  Filled 2017-02-04: qty 30

## 2017-02-04 MED ORDER — FENTANYL CITRATE (PF) 100 MCG/2ML IJ SOLN
25.0000 ug | INTRAMUSCULAR | Status: DC | PRN
  Administered 2017-02-04 (×2): 50 ug via INTRAVENOUS
  Filled 2017-02-04: qty 2

## 2017-02-04 MED ORDER — PROPOFOL 10 MG/ML IV BOLUS
INTRAVENOUS | Status: AC
  Filled 2017-02-04: qty 20

## 2017-02-04 MED ORDER — POVIDONE-IODINE 10 % OINT PACKET
TOPICAL_OINTMENT | CUTANEOUS | Status: DC | PRN
  Administered 2017-02-04: 1 via TOPICAL

## 2017-02-04 MED ORDER — ROCURONIUM 10MG/ML (10ML) SYRINGE FOR MEDFUSION PUMP - OPTIME
INTRAVENOUS | Status: DC | PRN
  Administered 2017-02-04: 35 mg via INTRAVENOUS

## 2017-02-04 MED ORDER — PROPOFOL 10 MG/ML IV BOLUS
INTRAVENOUS | Status: DC | PRN
  Administered 2017-02-04: 160 mg via INTRAVENOUS

## 2017-02-04 MED ORDER — ONDANSETRON HCL 4 MG/2ML IJ SOLN
INTRAMUSCULAR | Status: AC
  Filled 2017-02-04: qty 2

## 2017-02-04 MED ORDER — LACTATED RINGERS IV SOLN
INTRAVENOUS | Status: DC
  Administered 2017-02-04: 07:00:00 via INTRAVENOUS

## 2017-02-04 MED ORDER — GLYCOPYRROLATE 0.2 MG/ML IJ SOLN
INTRAMUSCULAR | Status: DC | PRN
  Administered 2017-02-04: .6 mg via INTRAVENOUS

## 2017-02-04 MED ORDER — SODIUM CHLORIDE 0.9 % IR SOLN
Status: DC | PRN
  Administered 2017-02-04: 1000 mL

## 2017-02-04 MED ORDER — BUPIVACAINE HCL (PF) 0.5 % IJ SOLN
INTRAMUSCULAR | Status: DC | PRN
  Administered 2017-02-04: 10 mL

## 2017-02-04 MED ORDER — MIDAZOLAM HCL 2 MG/2ML IJ SOLN
1.0000 mg | INTRAMUSCULAR | Status: AC
  Administered 2017-02-04 (×2): 2 mg via INTRAVENOUS
  Filled 2017-02-04: qty 2

## 2017-02-04 MED ORDER — KETOROLAC TROMETHAMINE 30 MG/ML IJ SOLN
30.0000 mg | Freq: Once | INTRAMUSCULAR | Status: AC
  Administered 2017-02-04: 30 mg via INTRAVENOUS
  Filled 2017-02-04: qty 1

## 2017-02-04 MED ORDER — LIDOCAINE HCL (CARDIAC) 10 MG/ML IV SOLN
INTRAVENOUS | Status: DC | PRN
  Administered 2017-02-04: 40 mg via INTRAVENOUS

## 2017-02-04 MED ORDER — HEMOSTATIC AGENTS (NO CHARGE) OPTIME
TOPICAL | Status: DC | PRN
  Administered 2017-02-04: 1 via TOPICAL

## 2017-02-04 MED ORDER — CHLORHEXIDINE GLUCONATE CLOTH 2 % EX PADS: 6.0000 | MEDICATED_PAD | Freq: Once | CUTANEOUS | Status: DC

## 2017-02-04 MED ORDER — NEOSTIGMINE METHYLSULFATE 10 MG/10ML IV SOLN
INTRAVENOUS | Status: DC | PRN
  Administered 2017-02-04: 3 mg via INTRAVENOUS

## 2017-02-04 MED ORDER — CIPROFLOXACIN IN D5W 400 MG/200ML IV SOLN
400.0000 mg | INTRAVENOUS | Status: AC
  Administered 2017-02-04: 400 mg via INTRAVENOUS
  Filled 2017-02-04: qty 200

## 2017-02-04 MED ORDER — FENTANYL CITRATE (PF) 100 MCG/2ML IJ SOLN
INTRAMUSCULAR | Status: DC | PRN
  Administered 2017-02-04 (×5): 50 ug via INTRAVENOUS

## 2017-02-04 MED ORDER — FENTANYL CITRATE (PF) 250 MCG/5ML IJ SOLN
INTRAMUSCULAR | Status: AC
  Filled 2017-02-04: qty 5

## 2017-02-04 MED ORDER — MIDAZOLAM HCL 2 MG/2ML IJ SOLN
INTRAMUSCULAR | Status: AC
  Filled 2017-02-04: qty 2

## 2017-02-04 MED ORDER — ONDANSETRON HCL 4 MG/2ML IJ SOLN
4.0000 mg | Freq: Once | INTRAMUSCULAR | Status: AC
  Administered 2017-02-04: 4 mg via INTRAVENOUS

## 2017-02-04 SURGICAL SUPPLY — 49 items
APPLIER CLIP LAPSCP 10X32 DD (CLIP) ×3 IMPLANT
BAG HAMPER (MISCELLANEOUS) ×3 IMPLANT
BAG RETRIEVAL 10 (BASKET) ×1
BAG RETRIEVAL 10MM (BASKET) ×1
CHLORAPREP W/TINT 26ML (MISCELLANEOUS) ×3 IMPLANT
CLOTH BEACON ORANGE TIMEOUT ST (SAFETY) ×3 IMPLANT
COVER LIGHT HANDLE STERIS (MISCELLANEOUS) ×6 IMPLANT
DECANTER SPIKE VIAL GLASS SM (MISCELLANEOUS) ×3 IMPLANT
ELECT REM PT RETURN 9FT ADLT (ELECTROSURGICAL) ×3
ELECTRODE REM PT RTRN 9FT ADLT (ELECTROSURGICAL) ×1 IMPLANT
FILTER SMOKE EVAC LAPAROSHD (FILTER) ×3 IMPLANT
FORMALIN 10 PREFIL 120ML (MISCELLANEOUS) ×3 IMPLANT
GLOVE BIOGEL PI IND STRL 6.5 (GLOVE) IMPLANT
GLOVE BIOGEL PI IND STRL 7.0 (GLOVE) ×1 IMPLANT
GLOVE BIOGEL PI INDICATOR 6.5 (GLOVE) ×4
GLOVE BIOGEL PI INDICATOR 7.0 (GLOVE) ×2
GLOVE ECLIPSE 6.5 STRL STRAW (GLOVE) ×2 IMPLANT
GLOVE SURG SS PI 7.5 STRL IVOR (GLOVE) ×3 IMPLANT
GOWN STRL REUS W/ TWL XL LVL3 (GOWN DISPOSABLE) ×1 IMPLANT
GOWN STRL REUS W/TWL LRG LVL3 (GOWN DISPOSABLE) ×6 IMPLANT
GOWN STRL REUS W/TWL XL LVL3 (GOWN DISPOSABLE) ×3
HEMOSTAT SNOW SURGICEL 2X4 (HEMOSTASIS) ×3 IMPLANT
INST SET LAPROSCOPIC AP (KITS) ×3 IMPLANT
IV NS IRRIG 3000ML ARTHROMATIC (IV SOLUTION) IMPLANT
KIT ROOM TURNOVER APOR (KITS) ×3 IMPLANT
MANIFOLD NEPTUNE II (INSTRUMENTS) ×3 IMPLANT
NDL INSUFFLATION 14GA 120MM (NEEDLE) ×1 IMPLANT
NEEDLE INSUFFLATION 14GA 120MM (NEEDLE) ×3 IMPLANT
NS IRRIG 1000ML POUR BTL (IV SOLUTION) ×3 IMPLANT
PACK LAP CHOLE LZT030E (CUSTOM PROCEDURE TRAY) ×3 IMPLANT
PAD ARMBOARD 7.5X6 YLW CONV (MISCELLANEOUS) ×3 IMPLANT
SET BASIN LINEN APH (SET/KITS/TRAYS/PACK) ×3 IMPLANT
SET TUBE IRRIG SUCTION NO TIP (IRRIGATION / IRRIGATOR) IMPLANT
SLEEVE ENDOPATH XCEL 5M (ENDOMECHANICALS) ×3 IMPLANT
SPONGE GAUZE 2X2 8PLY STER LF (GAUZE/BANDAGES/DRESSINGS) ×1
SPONGE GAUZE 2X2 8PLY STRL LF (GAUZE/BANDAGES/DRESSINGS) ×5 IMPLANT
STAPLER VISISTAT (STAPLE) ×3 IMPLANT
SUT VICRYL 0 UR6 27IN ABS (SUTURE) ×3 IMPLANT
SYS BAG RETRIEVAL 10MM (BASKET) ×1
SYSTEM BAG RETRIEVAL 10MM (BASKET) ×1 IMPLANT
TAPE SURG TRANSPORE 1 IN (GAUZE/BANDAGES/DRESSINGS) IMPLANT
TAPE SURGICAL TRANSPORE 1 IN (GAUZE/BANDAGES/DRESSINGS) ×2
TROCAR ENDO BLADELESS 11MM (ENDOMECHANICALS) ×3 IMPLANT
TROCAR XCEL NON-BLD 5MMX100MML (ENDOMECHANICALS) ×3 IMPLANT
TROCAR XCEL UNIV SLVE 11M 100M (ENDOMECHANICALS) ×3 IMPLANT
TUBE CONNECTING 12'X1/4 (SUCTIONS) ×1
TUBE CONNECTING 12X1/4 (SUCTIONS) ×2 IMPLANT
TUBING INSUFFLATION (TUBING) ×3 IMPLANT
WARMER LAPAROSCOPE (MISCELLANEOUS) ×3 IMPLANT

## 2017-02-04 NOTE — Anesthesia Procedure Notes (Addendum)
Procedure Name: Intubation Date/Time: 02/04/2017 7:43 AM Performed by: Tressie Stalker E Pre-anesthesia Checklist: Patient identified, Patient being monitored, Timeout performed, Emergency Drugs available and Suction available Patient Re-evaluated:Patient Re-evaluated prior to inductionOxygen Delivery Method: Circle system utilized Preoxygenation: Pre-oxygenation with 100% oxygen Intubation Type: IV induction Ventilation: Mask ventilation without difficulty Laryngoscope Size: Glidescope and 3 Grade View: Grade I Tube type: Oral Tube size: 7.0 mm Number of attempts: 1 Airway Equipment and Method: Rigid stylet Placement Confirmation: ETT inserted through vocal cords under direct vision,  positive ETCO2 and breath sounds checked- equal and bilateral Secured at: 21 cm Tube secured with: Tape Dental Injury: Teeth and Oropharynx as per pre-operative assessment

## 2017-02-04 NOTE — Discharge Instructions (Signed)
Laparoscopic Cholecystectomy, Care After °This sheet gives you information about how to care for yourself after your procedure. Your health care provider may also give you more specific instructions. If you have problems or questions, contact your health care provider. °What can I expect after the procedure? °After the procedure, it is common to have: °· Pain at your incision sites. You will be given medicines to control this pain. °· Mild nausea or vomiting. °· Bloating and possible shoulder pain from the air-like gas that was used during the procedure. °Follow these instructions at home: °Incision care  ° °· Follow instructions from your health care provider about how to take care of your incisions. Make sure you: °¨ Wash your hands with soap and water before you change your bandage (dressing). If soap and water are not available, use hand sanitizer. °¨ Change your dressing as told by your health care provider. °¨ Leave stitches (sutures), skin glue, or adhesive strips in place. These skin closures may need to be in place for 2 weeks or longer. If adhesive strip edges start to loosen and curl up, you may trim the loose edges. Do not remove adhesive strips completely unless your health care provider tells you to do that. °· Do not take baths, swim, or use a hot tub until your health care provider approves. Ask your health care provider if you can take showers. You may only be allowed to take sponge baths for bathing. °· Check your incision area every day for signs of infection. Check for: °¨ More redness, swelling, or pain. °¨ More fluid or blood. °¨ Warmth. °¨ Pus or a bad smell. °Activity  °· Do not drive or use heavy machinery while taking prescription pain medicine. °· Do not lift anything that is heavier than 10 lb (4.5 kg) until your health care provider approves. °· Do not play contact sports until your health care provider approves. °· Do not drive for 24 hours if you were given a medicine to help you relax  (sedative). °· Rest as needed. Do not return to work or school until your health care provider approves. °General instructions  °· Take over-the-counter and prescription medicines only as told by your health care provider. °· To prevent or treat constipation while you are taking prescription pain medicine, your health care provider may recommend that you: °¨ Drink enough fluid to keep your urine clear or pale yellow. °¨ Take over-the-counter or prescription medicines. °¨ Eat foods that are high in fiber, such as fresh fruits and vegetables, whole grains, and beans. °¨ Limit foods that are high in fat and processed sugars, such as fried and sweet foods. °Contact a health care provider if: °· You develop a rash. °· You have more redness, swelling, or pain around your incisions. °· You have more fluid or blood coming from your incisions. °· Your incisions feel warm to the touch. °· You have pus or a bad smell coming from your incisions. °· You have a fever. °· One or more of your incisions breaks open. °Get help right away if: °· You have trouble breathing. °· You have chest pain. °· You have increasing pain in your shoulders. °· You faint or feel dizzy when you stand. °· You have severe pain in your abdomen. °· You have nausea or vomiting that lasts for more than one day. °· You have leg pain. °This information is not intended to replace advice given to you by your health care provider. Make sure you discuss any   questions you have with your health care provider. °Document Released: 08/16/2005 Document Revised: 03/06/2016 Document Reviewed: 02/02/2016 °Elsevier Interactive Patient Education © 2017 [employer] ° °

## 2017-02-04 NOTE — Anesthesia Preprocedure Evaluation (Signed)
Anesthesia Evaluation  Patient identified by MRN, date of birth, ID band Patient awake    Reviewed: Allergy & Precautions, NPO status , Patient's Chart, lab work & pertinent test results  Airway Mallampati: III  TM Distance: >3 FB     Dental  (+) Teeth Intact   Pulmonary neg pulmonary ROS,    breath sounds clear to auscultation       Cardiovascular hypertension, Pt. on medications  Rhythm:Regular Rate:Normal     Neuro/Psych Anxiety Claustrophobianegative neurological ROS     GI/Hepatic negative GI ROS,   Endo/Other  Hypothyroidism   Renal/GU      Musculoskeletal   Abdominal   Peds  Hematology   Anesthesia Other Findings   Reproductive/Obstetrics                             Anesthesia Physical Anesthesia Plan  ASA: II  Anesthesia Plan: General   Post-op Pain Management:    Induction: Intravenous  PONV Risk Score and Plan:   Airway Management Planned: Oral ETT  Additional Equipment:   Intra-op Plan:   Post-operative Plan: Extubation in OR  Informed Consent: I have reviewed the patients History and Physical, chart, labs and discussed the procedure including the risks, benefits and alternatives for the proposed anesthesia with the patient or authorized representative who has indicated his/her understanding and acceptance.     Plan Discussed with:   Anesthesia Plan Comments:         Anesthesia Quick Evaluation

## 2017-02-04 NOTE — Anesthesia Postprocedure Evaluation (Signed)
Anesthesia Post Note  Patient: [name]  Procedure(s) Performed: Procedure(s) (LRB): LAPAROSCOPIC CHOLECYSTECTOMY (N/A)  Patient location during evaluation: Short Stay Anesthesia Type: General Level of consciousness: awake and alert Pain management: satisfactory to patient Vital Signs Assessment: post-procedure vital signs reviewed and stable Respiratory status: spontaneous breathing Cardiovascular status: stable Anesthetic complications: no     Last Vitals:  Vitals:   02/04/17 0850 02/04/17 0900  BP:  129/64  Pulse: 76 68  Resp: 14 16  Temp:      Last Pain:  Vitals:   02/04/17 0850  TempSrc:   PainSc: 8                  Siniya Lichty

## 2017-02-04 NOTE — Anesthesia Postprocedure Evaluation (Signed)
Anesthesia Post Note  Patient: [name]  Procedure(s) Performed: Procedure(s) (LRB): LAPAROSCOPIC CHOLECYSTECTOMY (N/A)  Patient location during evaluation: PACU Anesthesia Type: General Level of consciousness: awake and alert and oriented Pain management: pain level controlled Vital Signs Assessment: post-procedure vital signs reviewed and stable Respiratory status: spontaneous breathing and patient connected to nasal cannula oxygen Cardiovascular status: blood pressure returned to baseline and stable Postop Assessment: no signs of nausea or vomiting Anesthetic complications: no     Last Vitals:  Vitals:   02/04/17 0900 02/04/17 0915  BP: 129/64 123/72  Pulse: 68 63  Resp: 16 (!) 9  Temp:      Last Pain:  Vitals:   02/04/17 0915  TempSrc:   PainSc: 6                  Ajai Terhaar

## 2017-02-04 NOTE — Transfer of Care (Signed)
Immediate Anesthesia Transfer of Care Note  Patient: [name]  Procedure(s) Performed: Procedure(s): LAPAROSCOPIC CHOLECYSTECTOMY (N/A)  Patient Location: PACU  Anesthesia Type:General  Level of Consciousness: awake, alert  and oriented  Airway & Oxygen Therapy: Patient Spontanous Breathing and Patient connected to nasal cannula oxygen  Post-op Assessment: Report given to RN  Post vital signs: Reviewed and stable  Last Vitals:  Vitals:   02/04/17 0700 02/04/17 0715  BP: 133/72 131/83  Resp: (!) 36 15  Temp:      Last Pain:  Vitals:   02/04/17 0623  TempSrc: Oral  PainSc: 8       Patients Stated Pain Goal: 8 (16/10/96 0454)  Complications: No apparent anesthesia complications

## 2017-02-04 NOTE — Op Note (Signed)
Patient:  [name]  DOB:  04-29-1951  MRN:  [id number]   Preop Diagnosis:  Cholecystitis, cholelithiasis  Postop Diagnosis:  Same  Procedure:  Laparoscopic cholecystectomy  Surgeon:  Aviva Signs, M.D.  Anes:  Gen. endotracheal  Indications:  Patient is a 66 year old white female who presents with cholecystitis secondary to cholelithiasis. The risks and benefits of the procedure including bleeding, infection, hepatobiliary injury, and the possibility of an open procedure were fully explained to the patient, who gave informed consent.  Procedure note:  The patient was placed in the supine position. After induction of general endotracheal anesthesia, the abdomen was prepped and draped using the usual sterile technique with DuraPrep. Surgical site confirmation was performed.  A supraumbilical incision was made down the fascia. A Veress needle was introduced into the abdominal cavity and confirmation of placement was done using the saline drop test. The abdomen was then insufflated to 16 mmHg pressure. An 11 mm trocar was introduced into the abdominal cavity under direct visualization without difficulty. The patient was placed in reverse Trendelenburg position and an additional 11 mm trocar was placed the epigastric region and 5 mm trochars were placed the right upper quadrant and right flank regions. The liver was inspected and noted to be within normal limits. The gallbladder was retracted in a dynamic fashion in order to provide a critical view of the triangle of [place]. The cystic duct was first identified. Its juncture to the infundibulum was fully identified. Endoclips were placed proximally and distally on the cystic duct, and the cystic duct was divided. This was likewise done to the cystic artery. The gallbladder was freed away from the gallbladder fossa using Bovie electrocautery. The gallbladder was delivered through the epigastric trocar site using an Endo Catch bag. The gallbladder  fossa was inspected and no abnormal bleeding or bile leakage was noted. Surgicel was placed in the gallbladder fossa. All fluid and air were then evacuated from the abdominal cavity prior to the removal of the trochars.  All wounds were irrigated with normal saline. All wounds were injected with 0.5% Sensorcaine. The supraumbilical fascia was reapproximated using an 0 Vicryl interrupted suture. All skin incisions were closed using staples. Betadine ointment and dry sterile dressings were applied.  All tape and needle counts were correct at the end of the procedure. The patient was extubated in the operating room and transferred to PACU in stable condition.  Complications:  None  EBL:  Minimal  Specimen:  Gallbladder

## 2017-02-04 NOTE — Interval H&P Note (Signed)
History and Physical Interval Note:  02/04/2017 7:16 AM  [name]  has presented today for surgery, with the diagnosis of biliary colic, cholelithiasis  The various methods of treatment have been discussed with the patient and family. After consideration of risks, benefits and other options for treatment, the patient has consented to  Procedure(s): LAPAROSCOPIC CHOLECYSTECTOMY (N/A) as a surgical intervention .  The patient's history has been reviewed, patient examined, no change in status, stable for surgery.  I have reviewed the patient's chart and labs.  Questions were answered to the patient's satisfaction.     Aviva Signs

## 2017-02-07 ENCOUNTER — Encounter (HOSPITAL_COMMUNITY): Payer: Self-pay | Admitting: General Surgery

## 2017-02-10 NOTE — Patient Instructions (Addendum)
[name]  02/10/2017   Your procedure is scheduled on: 02-16-17  Report to [facility] Main  Entrance  Report to Admitting at 7:45 AM   Call this number if you have problems the morning of surgery  [phone]   Remember: ONLY 1 PERSON MAY GO WITH YOU TO SHORT STAY TO GET  READY MORNING OF YOUR SURGERY.  Do not eat food or drink liquids :After Midnight.     Take these medicines the morning of surgery with A SIP OF WATER: Synthroid (Levothyroxine)                                You may not have any metal on your body including hair pins and              piercings  Do not wear jewelry, make-up, lotions, powders or perfumes, deodorant             Do not wear nail polish.  Do not shave  48 hours prior to surgery.                Do not bring valuables to the hospital. [name].  Contacts, dentures or bridgework may not be worn into surgery.  Leave suitcase in the car. After surgery it may be brought to your room.     Please read over the following fact sheets you were given: _____________________________________________________________________             [facility] - Preparing for Surgery Before surgery, you can play an important role.  Because skin is not sterile, your skin needs to be as free of germs as possible.  You can reduce the number of germs on your skin by washing with CHG (chlorahexidine gluconate) soap before surgery.  CHG is an antiseptic cleaner which kills germs and bonds with the skin to continue killing germs even after washing. Please DO NOT use if you have an allergy to CHG or antibacterial soaps.  If your skin becomes reddened/irritated stop using the CHG and inform your nurse when you arrive at Short Stay. Do not shave (including legs and underarms) for at least 48 hours prior to the first CHG shower.  You may shave your face/neck. Please follow these instructions carefully:  1.   Shower with CHG Soap the night before surgery and the  morning of Surgery.  2.  If you choose to wash your hair, wash your hair first as usual with your  normal  shampoo.  3.  After you shampoo, rinse your hair and body thoroughly to remove the  shampoo.                           4.  Use CHG as you would any other liquid soap.  You can apply chg directly  to the skin and wash                       Gently with a scrungie or clean washcloth.  5.  Apply the CHG Soap to your body ONLY FROM THE NECK DOWN.   Do not use on face/ open  Wound or open sores. Avoid contact with eyes, ears mouth and genitals (private parts).                       Wash face,  Genitals (private parts) with your normal soap.             6.  Wash thoroughly, paying special attention to the area where your surgery  will be performed.  7.  Thoroughly rinse your body with warm water from the neck down.  8.  DO NOT shower/wash with your normal soap after using and rinsing off  the CHG Soap.                9.  Pat yourself dry with a clean towel.            10.  Wear clean pajamas.            11.  Place clean sheets on your bed the night of your first shower and do not  sleep with pets. Day of Surgery : Do not apply any lotions/deodorants the morning of surgery.  Please wear clean clothes to the hospital/surgery center.  FAILURE TO FOLLOW THESE INSTRUCTIONS MAY RESULT IN THE CANCELLATION OF YOUR SURGERY PATIENT SIGNATURE_________________________________  NURSE SIGNATURE__________________________________  ________________________________________________________________________   [name]  An incentive spirometer is a tool that can help keep your lungs clear and active. This tool measures how well you are filling your lungs with each breath. Taking long deep breaths may help reverse or decrease the chance of developing breathing (pulmonary) problems (especially infection) following:  A long  period of time when you are unable to move or be active. BEFORE THE PROCEDURE   If the spirometer includes an indicator to show your best effort, your nurse or respiratory therapist will set it to a desired goal.  If possible, sit up straight or lean slightly forward. Try not to slouch.  Hold the incentive spirometer in an upright position. INSTRUCTIONS FOR USE  1. Sit on the edge of your bed if possible, or sit up as far as you can in bed or on a chair. 2. Hold the incentive spirometer in an upright position. 3. Breathe out normally. 4. Place the mouthpiece in your mouth and seal your lips tightly around it. 5. Breathe in slowly and as deeply as possible, raising the piston or the ball toward the top of the column. 6. Hold your breath for 3-5 seconds or for as long as possible. Allow the piston or ball to fall to the bottom of the column. 7. Remove the mouthpiece from your mouth and breathe out normally. 8. Rest for a few seconds and repeat Steps 1 through 7 at least 10 times every 1-2 hours when you are awake. Take your time and take a few normal breaths between deep breaths. 9. The spirometer may include an indicator to show your best effort. Use the indicator as a goal to work toward during each repetition. 10. After each set of 10 deep breaths, practice coughing to be sure your lungs are clear. If you have an incision (the cut made at the time of surgery), support your incision when coughing by placing a pillow or rolled up towels firmly against it. Once you are able to get out of bed, walk around indoors and cough well. You may stop using the incentive spirometer when instructed by your caregiver.  RISKS AND COMPLICATIONS  Take your time so you do not get  dizzy or light-headed.  If you are in pain, you may need to take or ask for pain medication before doing incentive spirometry. It is harder to take a deep breath if you are having pain. AFTER USE  Rest and breathe slowly and  easily.  It can be helpful to keep track of a log of your progress. Your caregiver can provide you with a simple table to help with this. If you are using the spirometer at home, follow these instructions: [name] IF:   You are having difficultly using the spirometer.  You have trouble using the spirometer as often as instructed.  Your pain medication is not giving enough relief while using the spirometer.  You develop fever of 100.5 F (38.1 C) or higher. SEEK IMMEDIATE MEDICAL CARE IF:   You cough up bloody sputum that had not been present before.  You develop fever of 102 F (38.9 C) or greater.  You develop worsening pain at or near the incision site. MAKE SURE YOU:   Understand these instructions.  Will watch your condition.  Will get help right away if you are not doing well or get worse. Document Released: 12/27/2006 Document Revised: 11/08/2011 Document Reviewed: 02/27/2007 [facility] Patient Information 2014 [place], [place].   ________________________________________________________________________

## 2017-02-11 ENCOUNTER — Encounter (HOSPITAL_COMMUNITY)
Admission: RE | Admit: 2017-02-11 | Discharge: 2017-02-11 | Disposition: A | Discharge status: Home / Self Care | Payer: Medicare Other | Source: Ambulatory Visit | Attending: Orthopedic Surgery | Admitting: Orthopedic Surgery

## 2017-02-11 ENCOUNTER — Encounter (HOSPITAL_COMMUNITY): Payer: Self-pay

## 2017-02-11 DIAGNOSIS — Z969 Presence of functional implant, unspecified: Secondary | ICD-10-CM | POA: Diagnosis not present

## 2017-02-11 DIAGNOSIS — Z01812 Encounter for preprocedural laboratory examination: Secondary | ICD-10-CM | POA: Insufficient documentation

## 2017-02-11 DIAGNOSIS — M1712 Unilateral primary osteoarthritis, left knee: Secondary | ICD-10-CM | POA: Diagnosis not present

## 2017-02-11 LAB — BASIC METABOLIC PANEL
Anion gap: 11 (ref 5–15)
BUN: 12 mg/dL (ref 6–20)
CO2: 26 mmol/L (ref 22–32)
Calcium: 9.3 mg/dL (ref 8.9–10.3)
Chloride: 102 mmol/L (ref 101–111)
Creatinine, Ser: 0.84 mg/dL (ref 0.44–1.00)
GFR calc Af Amer: >60 mL/min (ref >60)
GFR calc non Af Amer: >60 mL/min (ref >60)
Glucose, Bld: 100 mg/dL — ABNORMAL HIGH (ref 65–99)
Potassium: 4.1 mmol/L (ref 3.5–5.1)
Sodium: 139 mmol/L (ref 135–145)

## 2017-02-11 LAB — CBC
HCT: 37.4 % (ref 36.0–46.0)
Hemoglobin: 12.3 g/dL (ref 12.0–15.0)
MCH: 31.5 pg (ref 26.0–34.0)
MCHC: 32.9 g/dL (ref 30.0–36.0)
MCV: 95.9 fL (ref 78.0–100.0)
Platelets: 396 10*3/uL (ref 150–400)
RBC: 3.9 MIL/uL (ref 3.87–5.11)
RDW: 12.9 % (ref 11.5–15.5)
WBC: 10.7 10*3/uL — ABNORMAL HIGH (ref 4.0–10.5)

## 2017-02-11 NOTE — Progress Notes (Signed)
02-02-17 (EPIC) EKG, CBC and BMP

## 2017-02-15 ENCOUNTER — Ambulatory Visit (INDEPENDENT_AMBULATORY_CARE_PROVIDER_SITE_OTHER): Payer: Self-pay | Admitting: General Surgery

## 2017-02-15 ENCOUNTER — Encounter: Payer: Self-pay | Admitting: General Surgery

## 2017-02-15 VITALS — BP 160/85 | HR 90 | Temp 96.5°F | Resp 18 | Ht 64.0 in | Wt 192.0 lb

## 2017-02-15 DIAGNOSIS — Z09 Encounter for follow-up examination after completed treatment for conditions other than malignant neoplasm: Secondary | ICD-10-CM

## 2017-02-15 NOTE — Progress Notes (Signed)
Subjective:     [name]    Status post laparoscopic cholecystectomy.  Doing well.  Did have some right shoulder pain, but I explained to her that this was secondary to her abdomen being distended with gas. Objective:    BP (!) 160/85   Pulse 90   Temp (!) 96.5 F (35.8 C)   Resp 18   Ht 5\' 4"  (1.626 m)   Wt 192 lb (87.1 kg)   BMI 32.96 kg/m   General:  alert, cooperative and no distress    Abdomen soft, incisions healing well.  Staples removed, Steri-Strips applied.   Final pathology consistent with diagnosis.     Assessment:    Doing well postoperatively.    Plan:    Patient can increase her activity as able.  She is to undergo orthopedic surgery tomorrow.  Follow up here as needed.

## 2017-02-16 ENCOUNTER — Encounter (HOSPITAL_COMMUNITY)
Admission: RE | Disposition: A | Discharge status: Home / Self Care | Payer: Self-pay | Source: Ambulatory Visit | Attending: Orthopedic Surgery

## 2017-02-16 ENCOUNTER — Ambulatory Visit (HOSPITAL_COMMUNITY): Payer: Medicare Other | Admitting: Anesthesiology

## 2017-02-16 ENCOUNTER — Observation Stay (HOSPITAL_COMMUNITY)
Admission: RE | Admit: 2017-02-16 | Discharge: 2017-02-17 | Disposition: A | Discharge status: Home / Self Care | Payer: Medicare Other | Source: Ambulatory Visit | Attending: Orthopedic Surgery | Admitting: Orthopedic Surgery

## 2017-02-16 ENCOUNTER — Encounter (HOSPITAL_COMMUNITY): Payer: Self-pay | Admitting: General Practice

## 2017-02-16 DIAGNOSIS — Z79899 Other long term (current) drug therapy: Secondary | ICD-10-CM | POA: Insufficient documentation

## 2017-02-16 DIAGNOSIS — M179 Osteoarthritis of knee, unspecified: Secondary | ICD-10-CM | POA: Diagnosis present

## 2017-02-16 DIAGNOSIS — Z6832 Body mass index (BMI) 32.0-32.9, adult: Secondary | ICD-10-CM | POA: Insufficient documentation

## 2017-02-16 DIAGNOSIS — Z969 Presence of functional implant, unspecified: Secondary | ICD-10-CM

## 2017-02-16 DIAGNOSIS — F40240 Claustrophobia: Secondary | ICD-10-CM | POA: Insufficient documentation

## 2017-02-16 DIAGNOSIS — Z9049 Acquired absence of other specified parts of digestive tract: Secondary | ICD-10-CM | POA: Insufficient documentation

## 2017-02-16 DIAGNOSIS — F419 Anxiety disorder, unspecified: Secondary | ICD-10-CM | POA: Diagnosis not present

## 2017-02-16 DIAGNOSIS — E039 Hypothyroidism, unspecified: Secondary | ICD-10-CM | POA: Insufficient documentation

## 2017-02-16 DIAGNOSIS — M25562 Pain in left knee: Secondary | ICD-10-CM | POA: Insufficient documentation

## 2017-02-16 DIAGNOSIS — E669 Obesity, unspecified: Secondary | ICD-10-CM | POA: Insufficient documentation

## 2017-02-16 DIAGNOSIS — Z472 Encounter for removal of internal fixation device: Secondary | ICD-10-CM | POA: Diagnosis not present

## 2017-02-16 DIAGNOSIS — M1710 Unilateral primary osteoarthritis, unspecified knee: Secondary | ICD-10-CM | POA: Diagnosis present

## 2017-02-16 DIAGNOSIS — X58XXXA Exposure to other specified factors, initial encounter: Secondary | ICD-10-CM | POA: Diagnosis not present

## 2017-02-16 DIAGNOSIS — T8489XA Other specified complication of internal orthopedic prosthetic devices, implants and grafts, initial encounter: Secondary | ICD-10-CM | POA: Diagnosis not present

## 2017-02-16 DIAGNOSIS — T8484XA Pain due to internal orthopedic prosthetic devices, implants and grafts, initial encounter: Secondary | ICD-10-CM | POA: Diagnosis present

## 2017-02-16 DIAGNOSIS — M1732 Unilateral post-traumatic osteoarthritis, left knee: Secondary | ICD-10-CM | POA: Diagnosis not present

## 2017-02-16 DIAGNOSIS — M1731 Unilateral post-traumatic osteoarthritis, right knee: Secondary | ICD-10-CM | POA: Diagnosis not present

## 2017-02-16 HISTORY — PX: HARDWARE REMOVAL: SHX979

## 2017-02-16 LAB — CBC
HCT: 34.9 % — ABNORMAL LOW (ref 36.0–46.0)
Hemoglobin: 11.7 g/dL — ABNORMAL LOW (ref 12.0–15.0)
MCH: 31.8 pg (ref 26.0–34.0)
MCHC: 33.5 g/dL (ref 30.0–36.0)
MCV: 94.8 fL (ref 78.0–100.0)
Platelets: 384 10*3/uL (ref 150–400)
RBC: 3.68 MIL/uL — ABNORMAL LOW (ref 3.87–5.11)
RDW: 12.8 % (ref 11.5–15.5)
WBC: 12 10*3/uL — ABNORMAL HIGH (ref 4.0–10.5)

## 2017-02-16 LAB — CREATININE, SERUM
Creatinine, Ser: 0.86 mg/dL (ref 0.44–1.00)
GFR calc Af Amer: >60 mL/min (ref >60)
GFR calc non Af Amer: >60 mL/min (ref >60)

## 2017-02-16 SURGERY — REMOVAL, HARDWARE
Anesthesia: General | Site: Knee | Laterality: Left

## 2017-02-16 MED ORDER — FENTANYL CITRATE (PF) 250 MCG/5ML IJ SOLN
INTRAMUSCULAR | Status: AC
  Filled 2017-02-16: qty 5

## 2017-02-16 MED ORDER — PHENYLEPHRINE 40 MCG/ML (10ML) SYRINGE FOR IV PUSH (FOR BLOOD PRESSURE SUPPORT)
PREFILLED_SYRINGE | INTRAVENOUS | Status: DC | PRN
  Administered 2017-02-16 (×2): 80 ug via INTRAVENOUS
  Administered 2017-02-16: 120 ug via INTRAVENOUS
  Administered 2017-02-16: 80 ug via INTRAVENOUS

## 2017-02-16 MED ORDER — TRAMADOL HCL 50 MG PO TABS
50.0000 mg | ORAL_TABLET | Freq: Four times a day (QID) | ORAL | Status: DC | PRN
  Administered 2017-02-16: 50 mg via ORAL
  Filled 2017-02-16: qty 1

## 2017-02-16 MED ORDER — ONDANSETRON HCL 4 MG/2ML IJ SOLN
INTRAMUSCULAR | Status: AC
  Filled 2017-02-16: qty 2

## 2017-02-16 MED ORDER — HYDROMORPHONE HCL 1 MG/ML IJ SOLN
0.2500 mg | INTRAMUSCULAR | Status: DC | PRN
  Administered 2017-02-16 (×4): 0.5 mg via INTRAVENOUS

## 2017-02-16 MED ORDER — EPHEDRINE 5 MG/ML INJ
INTRAVENOUS | Status: AC
  Filled 2017-02-16: qty 10

## 2017-02-16 MED ORDER — IRBESARTAN 150 MG PO TABS
300.0000 mg | ORAL_TABLET | Freq: Every day | ORAL | Status: DC
Start: 2017-02-17 — End: 2017-02-17
  Filled 2017-02-16: qty 2

## 2017-02-16 MED ORDER — LACTATED RINGERS IV SOLN
INTRAVENOUS | Status: DC
  Administered 2017-02-16 (×2): via INTRAVENOUS

## 2017-02-16 MED ORDER — HYDROCODONE-ACETAMINOPHEN 5-325 MG PO TABS
1.0000 | ORAL_TABLET | ORAL | Status: DC | PRN
  Administered 2017-02-17 (×3): 1 via ORAL
  Filled 2017-02-16 (×3): qty 1

## 2017-02-16 MED ORDER — CEFAZOLIN SODIUM-DEXTROSE 2-4 GM/100ML-% IV SOLN
INTRAVENOUS | Status: AC
  Filled 2017-02-16: qty 100

## 2017-02-16 MED ORDER — LIDOCAINE 2% (20 MG/ML) 5 ML SYRINGE
INTRAMUSCULAR | Status: AC
  Filled 2017-02-16: qty 5

## 2017-02-16 MED ORDER — KETAMINE HCL 10 MG/ML IJ SOLN
INTRAMUSCULAR | Status: AC
  Filled 2017-02-16: qty 1

## 2017-02-16 MED ORDER — SODIUM CHLORIDE 0.9 % IV SOLN
INTRAVENOUS | Status: DC
  Administered 2017-02-16: 18:00:00 via INTRAVENOUS

## 2017-02-16 MED ORDER — HYDROMORPHONE HCL 1 MG/ML IJ SOLN
INTRAMUSCULAR | Status: AC
  Filled 2017-02-16: qty 0.5

## 2017-02-16 MED ORDER — PROPOFOL 10 MG/ML IV BOLUS
INTRAVENOUS | Status: AC
  Filled 2017-02-16: qty 20

## 2017-02-16 MED ORDER — POVIDONE-IODINE 10 % EX SWAB: 2.0000 "application " | Freq: Once | CUTANEOUS | Status: DC

## 2017-02-16 MED ORDER — KETOROLAC TROMETHAMINE 30 MG/ML IJ SOLN: 30.0000 mg | Freq: Once | INTRAMUSCULAR | Status: DC | PRN

## 2017-02-16 MED ORDER — HYDROMORPHONE HCL 1 MG/ML IJ SOLN
INTRAMUSCULAR | Status: AC
  Administered 2017-02-16: 0.5 mg via INTRAVENOUS
  Filled 2017-02-16: qty 0.5

## 2017-02-16 MED ORDER — KETOROLAC TROMETHAMINE 30 MG/ML IJ SOLN
INTRAMUSCULAR | Status: DC | PRN
  Administered 2017-02-16: 30 mg via INTRAVENOUS

## 2017-02-16 MED ORDER — METOCLOPRAMIDE HCL 5 MG PO TABS: 5.0000 mg | ORAL_TABLET | Freq: Three times a day (TID) | ORAL | Status: DC | PRN

## 2017-02-16 MED ORDER — PROPOFOL 10 MG/ML IV BOLUS
INTRAVENOUS | Status: DC | PRN
  Administered 2017-02-16: 150 mg via INTRAVENOUS

## 2017-02-16 MED ORDER — ROCURONIUM BROMIDE 50 MG/5ML IV SOSY
PREFILLED_SYRINGE | INTRAVENOUS | Status: AC
  Filled 2017-02-16: qty 5

## 2017-02-16 MED ORDER — ONDANSETRON HCL 4 MG/2ML IJ SOLN: 4.0000 mg | Freq: Once | INTRAMUSCULAR | Status: DC | PRN

## 2017-02-16 MED ORDER — FENTANYL CITRATE (PF) 100 MCG/2ML IJ SOLN
INTRAMUSCULAR | Status: AC
  Administered 2017-02-16: 50 ug via INTRAVENOUS
  Filled 2017-02-16: qty 4

## 2017-02-16 MED ORDER — DEXAMETHASONE SODIUM PHOSPHATE 10 MG/ML IJ SOLN
INTRAMUSCULAR | Status: AC
  Filled 2017-02-16: qty 1

## 2017-02-16 MED ORDER — KETAMINE HCL 10 MG/ML IJ SOLN
INTRAMUSCULAR | Status: DC | PRN
  Administered 2017-02-16 (×2): 10 mg via INTRAVENOUS
  Administered 2017-02-16: 30 mg via INTRAVENOUS

## 2017-02-16 MED ORDER — SUGAMMADEX SODIUM 200 MG/2ML IV SOLN
INTRAVENOUS | Status: AC
  Filled 2017-02-16: qty 2

## 2017-02-16 MED ORDER — FENTANYL CITRATE (PF) 100 MCG/2ML IJ SOLN
INTRAMUSCULAR | Status: DC | PRN
  Administered 2017-02-16 (×5): 50 ug via INTRAVENOUS

## 2017-02-16 MED ORDER — MORPHINE SULFATE (PF) 4 MG/ML IV SOLN
1.0000 mg | INTRAVENOUS | Status: DC | PRN
Start: 2017-02-16 — End: 2017-02-17

## 2017-02-16 MED ORDER — ONDANSETRON HCL 4 MG PO TABS: 4.0000 mg | ORAL_TABLET | Freq: Four times a day (QID) | ORAL | Status: DC | PRN

## 2017-02-16 MED ORDER — HYDROCODONE-ACETAMINOPHEN 5-325 MG PO TABS
1.0000 | ORAL_TABLET | ORAL | Status: DC | PRN
  Administered 2017-02-16: 1 via ORAL
  Filled 2017-02-16: qty 2
  Filled 2017-02-16: qty 1

## 2017-02-16 MED ORDER — ACETAMINOPHEN 325 MG PO TABS: 650.0000 mg | ORAL_TABLET | Freq: Four times a day (QID) | ORAL | Status: DC | PRN

## 2017-02-16 MED ORDER — CHLORHEXIDINE GLUCONATE 4 % EX LIQD: 60.0000 mL | Freq: Once | CUTANEOUS | Status: DC

## 2017-02-16 MED ORDER — METOCLOPRAMIDE HCL 5 MG/ML IJ SOLN: 5.0000 mg | Freq: Three times a day (TID) | INTRAMUSCULAR | Status: DC | PRN

## 2017-02-16 MED ORDER — ACETAMINOPHEN 650 MG RE SUPP: 650.0000 mg | Freq: Four times a day (QID) | RECTAL | Status: DC | PRN

## 2017-02-16 MED ORDER — SIMVASTATIN 20 MG PO TABS
20.0000 mg | ORAL_TABLET | Freq: Every day | ORAL | Status: DC
  Administered 2017-02-16: 20 mg via ORAL
  Filled 2017-02-16: qty 1

## 2017-02-16 MED ORDER — ACETAMINOPHEN 10 MG/ML IV SOLN
INTRAVENOUS | Status: AC
  Filled 2017-02-16: qty 100

## 2017-02-16 MED ORDER — DEXTROSE 5 % IV SOLN
500.0000 mg | Freq: Four times a day (QID) | INTRAVENOUS | Status: DC | PRN
  Administered 2017-02-16: 500 mg via INTRAVENOUS
  Filled 2017-02-16: qty 550

## 2017-02-16 MED ORDER — ENOXAPARIN SODIUM 40 MG/0.4ML ~~LOC~~ SOLN
40.0000 mg | SUBCUTANEOUS | Status: DC
  Administered 2017-02-17: 08:00:00 40 mg via SUBCUTANEOUS
  Filled 2017-02-16: qty 0.4

## 2017-02-16 MED ORDER — FENTANYL CITRATE (PF) 100 MCG/2ML IJ SOLN
25.0000 ug | INTRAMUSCULAR | Status: DC | PRN
  Administered 2017-02-16 (×2): 50 ug via INTRAVENOUS

## 2017-02-16 MED ORDER — ACETAMINOPHEN 500 MG PO TABS
1000.0000 mg | ORAL_TABLET | Freq: Four times a day (QID) | ORAL | Status: DC
  Administered 2017-02-16: 1000 mg via ORAL
  Filled 2017-02-16 (×2): qty 2

## 2017-02-16 MED ORDER — METHOCARBAMOL 500 MG PO TABS: 500.0000 mg | ORAL_TABLET | Freq: Four times a day (QID) | ORAL | Status: DC | PRN

## 2017-02-16 MED ORDER — CEFAZOLIN SODIUM-DEXTROSE 2-4 GM/100ML-% IV SOLN
2.0000 g | INTRAVENOUS | Status: AC
  Administered 2017-02-16: 2 g via INTRAVENOUS

## 2017-02-16 MED ORDER — OXYCODONE HCL 5 MG PO TABS
5.0000 mg | ORAL_TABLET | Freq: Once | ORAL | Status: AC
  Administered 2017-02-16: 10 mg via ORAL
  Filled 2017-02-16: qty 2

## 2017-02-16 MED ORDER — HYDROCODONE-ACETAMINOPHEN 5-325 MG PO TABS: 1.0000 | ORAL_TABLET | ORAL | Status: DC | PRN

## 2017-02-16 MED ORDER — DEXAMETHASONE SODIUM PHOSPHATE 10 MG/ML IJ SOLN
10.0000 mg | Freq: Once | INTRAMUSCULAR | Status: AC
  Administered 2017-02-16: 10 mg via INTRAVENOUS

## 2017-02-16 MED ORDER — GLYCOPYRROLATE 0.2 MG/ML IV SOSY
PREFILLED_SYRINGE | INTRAVENOUS | Status: AC
  Filled 2017-02-16: qty 5

## 2017-02-16 MED ORDER — GLYCOPYRROLATE 0.2 MG/ML IV SOSY
PREFILLED_SYRINGE | INTRAVENOUS | Status: DC | PRN
  Administered 2017-02-16: .2 mg via INTRAVENOUS

## 2017-02-16 MED ORDER — CEFAZOLIN SODIUM-DEXTROSE 2-4 GM/100ML-% IV SOLN
2.0000 g | Freq: Four times a day (QID) | INTRAVENOUS | Status: AC
  Administered 2017-02-16 – 2017-02-17 (×3): 2 g via INTRAVENOUS
  Filled 2017-02-16 (×3): qty 100

## 2017-02-16 MED ORDER — KETOROLAC TROMETHAMINE 30 MG/ML IJ SOLN
INTRAMUSCULAR | Status: AC
  Filled 2017-02-16: qty 1

## 2017-02-16 MED ORDER — LIDOCAINE 2% (20 MG/ML) 5 ML SYRINGE
INTRAMUSCULAR | Status: DC | PRN
  Administered 2017-02-16: 100 mg via INTRAVENOUS

## 2017-02-16 MED ORDER — MIDAZOLAM HCL 2 MG/2ML IJ SOLN
INTRAMUSCULAR | Status: AC
  Filled 2017-02-16: qty 2

## 2017-02-16 MED ORDER — ACETAMINOPHEN 10 MG/ML IV SOLN
1000.0000 mg | Freq: Once | INTRAVENOUS | Status: AC
  Administered 2017-02-16: 1000 mg via INTRAVENOUS

## 2017-02-16 MED ORDER — ONDANSETRON HCL 4 MG/2ML IJ SOLN
INTRAMUSCULAR | Status: DC | PRN
  Administered 2017-02-16: 4 mg via INTRAVENOUS

## 2017-02-16 MED ORDER — SODIUM CHLORIDE 0.9 % IR SOLN
Status: DC | PRN
  Administered 2017-02-16: 1000 mL

## 2017-02-16 MED ORDER — LEVOTHYROXINE SODIUM 100 MCG PO TABS
100.0000 ug | ORAL_TABLET | Freq: Every day | ORAL | Status: DC
  Administered 2017-02-17: 08:00:00 100 ug via ORAL
  Filled 2017-02-16: qty 1

## 2017-02-16 MED ORDER — MEPERIDINE HCL 50 MG/ML IJ SOLN: 6.2500 mg | INTRAMUSCULAR | Status: DC | PRN

## 2017-02-16 MED ORDER — HYDROMORPHONE HCL 1 MG/ML IJ SOLN
INTRAMUSCULAR | Status: AC
  Administered 2017-02-16: 0.5 mg via INTRAVENOUS
  Filled 2017-02-16: qty 1

## 2017-02-16 MED ORDER — ONDANSETRON HCL 4 MG/2ML IJ SOLN: 4.0000 mg | Freq: Four times a day (QID) | INTRAMUSCULAR | Status: DC | PRN

## 2017-02-16 MED ORDER — MIDAZOLAM HCL 2 MG/2ML IJ SOLN
INTRAMUSCULAR | Status: DC | PRN
  Administered 2017-02-16: 2 mg via INTRAVENOUS

## 2017-02-16 SURGICAL SUPPLY — 43 items
BANDAGE ACE 6X5 VEL STRL LF (GAUZE/BANDAGES/DRESSINGS) ×3 IMPLANT
BANDAGE ESMARK 6X9 LF (GAUZE/BANDAGES/DRESSINGS) ×1 IMPLANT
BNDG CMPR 9X6 STRL LF SNTH (GAUZE/BANDAGES/DRESSINGS) ×1
BNDG ESMARK 6X9 LF (GAUZE/BANDAGES/DRESSINGS) ×3
CLOSURE WOUND 1/2 X4 (GAUZE/BANDAGES/DRESSINGS) ×1
COVER SURGICAL LIGHT HANDLE (MISCELLANEOUS) ×3 IMPLANT
CUFF TOURN SGL QUICK 18 (TOURNIQUET CUFF) IMPLANT
CUFF TOURN SGL QUICK 34 (TOURNIQUET CUFF)
CUFF TRNQT CYL 34X4X40X1 (TOURNIQUET CUFF) IMPLANT
DRAPE C-ARM 42X120 X-RAY (DRAPES) ×3 IMPLANT
DRAPE C-ARMOR (DRAPES) ×3 IMPLANT
DRAPE EXTREMITY T 121X128X90 (DRAPE) ×3 IMPLANT
DRAPE INCISE IOBAN 66X45 STRL (DRAPES) ×3 IMPLANT
DRAPE ORTHO SPLIT 77X108 STRL (DRAPES)
DRAPE SURG ORHT 6 SPLT 77X108 (DRAPES) IMPLANT
DRSG ADAPTIC 3X8 NADH LF (GAUZE/BANDAGES/DRESSINGS) ×3 IMPLANT
DRSG PAD ABDOMINAL 8X10 ST (GAUZE/BANDAGES/DRESSINGS) ×3 IMPLANT
DURAPREP 26ML APPLICATOR (WOUND CARE) ×3 IMPLANT
ELECT REM PT RETURN 15FT ADLT (MISCELLANEOUS) ×3 IMPLANT
EVACUATOR 1/8 PVC DRAIN (DRAIN) ×2 IMPLANT
GAUZE SPONGE 4X4 12PLY STRL (GAUZE/BANDAGES/DRESSINGS) ×3 IMPLANT
GLOVE BIO SURGEON STRL SZ7.5 (GLOVE) ×3 IMPLANT
GLOVE BIO SURGEON STRL SZ8 (GLOVE) ×6 IMPLANT
GLOVE BIOGEL PI IND STRL 8 (GLOVE) ×3 IMPLANT
GLOVE BIOGEL PI INDICATOR 8 (GLOVE) ×6
GOWN STRL REUS W/TWL LRG LVL3 (GOWN DISPOSABLE) ×3 IMPLANT
GOWN STRL REUS W/TWL XL LVL3 (GOWN DISPOSABLE) ×3 IMPLANT
KIT BASIN OR (CUSTOM PROCEDURE TRAY) ×3 IMPLANT
MANIFOLD NEPTUNE II (INSTRUMENTS) ×3 IMPLANT
NS IRRIG 1000ML POUR BTL (IV SOLUTION) ×3 IMPLANT
PACK TOTAL JOINT (CUSTOM PROCEDURE TRAY) ×3 IMPLANT
PADDING CAST COTTON 6X4 STRL (CAST SUPPLIES) ×4 IMPLANT
POSITIONER SURGICAL ARM (MISCELLANEOUS) ×3 IMPLANT
STAPLER VISISTAT 35W (STAPLE) ×4 IMPLANT
STRIP CLOSURE SKIN 1/2X4 (GAUZE/BANDAGES/DRESSINGS) ×2 IMPLANT
SUT MNCRL AB 4-0 PS2 18 (SUTURE) ×3 IMPLANT
SUT VIC AB 0 CT1 36 (SUTURE) ×6 IMPLANT
SUT VIC AB 2-0 CT1 27 (SUTURE) ×6
SUT VIC AB 2-0 CT1 TAPERPNT 27 (SUTURE) ×2 IMPLANT
SUT VLOC 180 0 24IN GS25 (SUTURE) ×2 IMPLANT
TOWEL OR 17X26 10 PK STRL BLUE (TOWEL DISPOSABLE) ×6 IMPLANT
UNDERPAD 30X30 (UNDERPADS AND DIAPERS) ×3 IMPLANT
WATER STERILE IRR 1500ML POUR (IV SOLUTION) ×3 IMPLANT

## 2017-02-16 NOTE — Anesthesia Procedure Notes (Signed)
Procedure Name: LMA Insertion Date/Time: 02/16/2017 9:35 AM Performed by: Danley Danker L Patient Re-evaluated:Patient Re-evaluated prior to inductionOxygen Delivery Method: Circle system utilized Preoxygenation: Pre-oxygenation with 100% oxygen Intubation Type: IV induction Ventilation: Mask ventilation without difficulty LMA: LMA inserted LMA Size: 4.0 Number of attempts: 1 Placement Confirmation: positive ETCO2 and breath sounds checked- equal and bilateral Tube secured with: Tape Dental Injury: Teeth and Oropharynx as per pre-operative assessment

## 2017-02-16 NOTE — Anesthesia Preprocedure Evaluation (Addendum)
Anesthesia Evaluation  Patient identified by MRN, date of birth, ID band Patient awake    Reviewed: Allergy & Precautions, H&P , NPO status , Patient's Chart, lab work & pertinent test results  Airway Mallampati: I  TM Distance: >3 FB Neck ROM: full    Dental no notable dental hx.    Pulmonary neg pulmonary ROS,    Pulmonary exam normal breath sounds clear to auscultation       Cardiovascular Exercise Tolerance: Good Normal cardiovascular exam Rhythm:regular Rate:Normal     Neuro/Psych negative neurological ROS     GI/Hepatic negative GI ROS, Neg liver ROS,   Endo/Other  Hypothyroidism   Renal/GU negative Renal ROS  negative genitourinary   Musculoskeletal   Abdominal (+) + obese,   Peds  Hematology negative hematology ROS (+)   Anesthesia Other Findings   Reproductive/Obstetrics negative OB ROS                             Anesthesia Physical Anesthesia Plan  ASA: II  Anesthesia Plan: General   Post-op Pain Management:    Induction: Intravenous  PONV Risk Score and Plan: 3 and Ondansetron, Dexamethasone, Propofol and Midazolam  Airway Management Planned: LMA  Additional Equipment:   Intra-op Plan:   Post-operative Plan:   Informed Consent: I have reviewed the patients History and Physical, chart, labs and discussed the procedure including the risks, benefits and alternatives for the proposed anesthesia with the patient or authorized representative who has indicated his/her understanding and acceptance.   Dental Advisory Given  Plan Discussed with: CRNA and Surgeon  Anesthesia Plan Comments:         Anesthesia Quick Evaluation

## 2017-02-16 NOTE — Progress Notes (Signed)
Pt c/o 8/10 pain and does not want to take IV medication. Pt has Norco 5/325mg  1-2 tablets Q4prn to start tomorrow @ 16:00 and is currently on scheduled 1,000mg  tylenol Q6hrs. Notified on-call physician for [place]. Received call back from [facility]. New orders to discontinue scheduled tylenol, one time does of oxycodone 5-10mg  and restart Norco in 4hrs. New orders implemented.

## 2017-02-16 NOTE — Brief Op Note (Signed)
02/16/2017  10:55 AM  PATIENT:  [name]  67 y.o. 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  PROCEDURE:  Procedure(s): HARDWARE REMOVAL LEFT KNEE (Left)  SURGEON:  Surgeon(s) and Role:    Gaynelle Arabian, MD - Primary  PHYSICIAN ASSISTANT:   ASSISTANTS: Molli Barrows, PA-C   ANESTHESIA:   general  EBL:  Total I/O In: 1000 [I.V.:1000] Out: 50 [Blood:50]  BLOOD ADMINISTERED:none  DRAINS: (Medium) Hemovact drain(s) in the right thigh with  Suction Open   LOCAL MEDICATIONS USED:  NONE  COUNTS:  YES  TOURNIQUET:   Total Tourniquet Time Documented: Thigh (Left) - 50 minutes Total: Thigh (Left) - 50 minutes   DICTATION: .Other Dictation: Dictation Number [id number]  PLAN OF CARE: Admit for overnight observation  PATIENT DISPOSITION:  PACU - hemodynamically stable.

## 2017-02-16 NOTE — Anesthesia Postprocedure Evaluation (Signed)
Anesthesia Post Note  Patient: [name]  Procedure(s) Performed: Procedure(s) (LRB): HARDWARE REMOVAL LEFT KNEE (Left)     Patient location during evaluation: PACU Anesthesia Type: General Level of consciousness: awake Pain management: pain level not controlled Vital Signs Assessment: post-procedure vital signs reviewed and stable Respiratory status: spontaneous breathing Cardiovascular status: stable Postop Assessment: no signs of nausea or vomiting Anesthetic complications: no    Last Vitals:  Vitals:   02/16/17 1145 02/16/17 1200  BP: 121/60 111/64  Pulse: 77 78  Resp: 13 14  Temp:      Last Pain:  Vitals:   02/16/17 1200  TempSrc:   PainSc: 8    Pain Goal: Patients Stated Pain Goal: 6 (02/16/17 0805)               Martin Belling JR,JOHN Mateo Flow

## 2017-02-16 NOTE — Transfer of Care (Signed)
Immediate Anesthesia Transfer of Care Note  Patient: [name]  Procedure(s) Performed: Procedure(s): HARDWARE REMOVAL LEFT KNEE (Left)  Patient Location: PACU  Anesthesia Type:General  Level of Consciousness: awake and oriented  Airway & Oxygen Therapy: Patient Spontanous Breathing and Patient connected to nasal cannula oxygen  Post-op Assessment: Report given to RN and Post -op Vital signs reviewed and stable  Post vital signs: Reviewed and stable  Last Vitals:  Vitals:   02/16/17 0805  BP: 135/62  Pulse: 85  Resp: 18  Temp: 37.1 C    Last Pain:  Vitals:   02/16/17 0805  TempSrc: Oral  PainSc: 7       Patients Stated Pain Goal: 6 (09/64/38 3818)  Complications: No apparent anesthesia complications

## 2017-02-16 NOTE — Interval H&P Note (Signed)
History and Physical Interval Note:  02/16/2017 8:34 AM  [name]  has presented today for surgery, with the diagnosis of Post-traumatic Oseteoarthritis Left knee  The various methods of treatment have been discussed with the patient and family. After consideration of risks, benefits and other options for treatment, the patient has consented to  Procedure(s): HARDWARE REMOVAL LEFT KNEE (Left) as a surgical intervention .  The patient's history has been reviewed, patient examined, no change in status, stable for surgery.  I have reviewed the patient's chart and labs.  Questions were answered to the patient's satisfaction.     Gearlean Alf
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
## 2017-02-17 DIAGNOSIS — M1732 Unilateral post-traumatic osteoarthritis, left knee: Secondary | ICD-10-CM | POA: Diagnosis not present

## 2017-02-17 DIAGNOSIS — Z472 Encounter for removal of internal fixation device: Secondary | ICD-10-CM | POA: Diagnosis not present

## 2017-02-17 DIAGNOSIS — T8484XA Pain due to internal orthopedic prosthetic devices, implants and grafts, initial encounter: Secondary | ICD-10-CM | POA: Diagnosis not present

## 2017-02-17 DIAGNOSIS — F419 Anxiety disorder, unspecified: Secondary | ICD-10-CM | POA: Diagnosis not present

## 2017-02-17 DIAGNOSIS — E039 Hypothyroidism, unspecified: Secondary | ICD-10-CM | POA: Diagnosis not present

## 2017-02-17 DIAGNOSIS — M25562 Pain in left knee: Secondary | ICD-10-CM | POA: Diagnosis not present

## 2017-02-17 MED ORDER — HYDROCODONE-ACETAMINOPHEN 5-325 MG PO TABS: 1.0000 | ORAL_TABLET | ORAL | 0 refills | Status: DC | PRN

## 2017-02-17 MED ORDER — METHOCARBAMOL 500 MG PO TABS: 500.0000 mg | ORAL_TABLET | Freq: Four times a day (QID) | ORAL | 0 refills | Status: DC | PRN

## 2017-02-17 NOTE — Evaluation (Signed)
Physical Therapy Evaluation Patient Details Name: CARIANNA LAGUE MRN: [id number] DOB: Feb 08, 1951 Today's Date: 02/17/2017   History of Present Illness  Pt s/p hardware removal L knee  Clinical Impression  Pt s/p hardware removal and presents with decreased L LE strength/ROM and post op pain limiting functional mobility.  Pt mobilizing at sup/MOD I level and eager for dc home.    Follow Up Recommendations No PT follow up    Equipment Recommendations  Crutches    Recommendations for Other Services       Precautions / Restrictions Precautions Precautions: Fall Restrictions Weight Bearing Restrictions: No Other Position/Activity Restrictions: WBAT      Mobility  Bed Mobility Overal bed mobility: Modified Independent             General bed mobility comments: Pt to/from EOB unassisted  Transfers Overall transfer level: Needs assistance Equipment used: None Transfers: Sit to/from Stand Sit to Stand: Min guard;Supervision         General transfer comment: cues for use of UEs to self assist  Ambulation/Gait Ambulation/Gait assistance: Min guard;Supervision Ambulation Distance (Feet): 180 Feet Assistive device: Rolling walker (2 wheeled);Crutches Gait Pattern/deviations: Step-through pattern;Decreased step length - right;Decreased step length - left;Shuffle;Trunk flexed Gait velocity: decr Gait velocity interpretation: Below normal speed for age/gender General Gait Details: 25' with RW and 110' with crutches.  Min cues for posture and position from device  Stairs Stairs: Yes Stairs assistance: Min guard Stair Management: Two rails;Step to pattern;Forwards Number of Stairs: 5 General stair comments: cues for sequence  Wheelchair Mobility    Modified Rankin (Stroke Patients Only)       Balance                                             Pertinent Vitals/Pain Pain Assessment: 0-10 Pain Score: 6  Pain Location: L knee Pain  Descriptors / Indicators: Aching;Sore Pain Intervention(s): Limited activity within patient's tolerance;Monitored during session;Premedicated before session;Ice applied    Home Living Family/patient expects to be discharged to:: Private residence Living Arrangements: Spouse/significant other Available Help at Discharge: Family Type of Home: House Home Access: Stairs to enter Entrance Stairs-Rails: Right;Left;Can reach both Technical brewer of Steps: 6 Home Layout: One level Home Equipment: None      Prior Function Level of Independence: Independent               Hand Dominance        Extremity/Trunk Assessment   Upper Extremity Assessment Upper Extremity Assessment: Overall WFL for tasks assessed    Lower Extremity Assessment Lower Extremity Assessment: LLE deficits/detail LLE Deficits / Details: 3/5 quads with IND SLR and AAROM at knee -10 - 50    Cervical / Trunk Assessment Cervical / Trunk Assessment: Normal  Communication   Communication: No difficulties  Cognition Arousal/Alertness: Awake/alert Behavior During Therapy: WFL for tasks assessed/performed Overall Cognitive Status: Within Functional Limits for tasks assessed                                        General Comments      Exercises General Exercises - Lower Extremity Ankle Circles/Pumps: AROM;Both;20 reps;Supine Quad Sets: AROM;Both;10 reps;Supine Long Arc Quad: AROM;Left;5 reps;Supine Heel Slides: AAROM;Left;10 reps;Supine Straight Leg Raises: AAROM;AROM;Left;10 reps;Supine   Assessment/Plan  PT Assessment Patient needs continued PT services  PT Problem List Decreased strength;Decreased range of motion;Decreased activity tolerance;Decreased mobility;Decreased knowledge of use of DME;Pain       PT Treatment Interventions DME instruction;Gait training;Stair training;Functional mobility training;Therapeutic activities;Therapeutic exercise;Patient/family education     PT Goals (Current goals can be found in the Care Plan section)  Acute Rehab PT Goals Patient Stated Goal: Regain IND and return for TKR PT Goal Formulation: All assessment and education complete, DC therapy    Frequency 7X/week   Barriers to discharge        Co-evaluation               AM-PAC PT "6 Clicks" Daily Activity  Outcome Measure Difficulty turning over in bed (including adjusting bedclothes, sheets and blankets)?: None Difficulty moving from lying on back to sitting on the side of the bed? : None Difficulty sitting down on and standing up from a chair with arms (e.g., wheelchair, bedside commode, etc,.)?: A Little Help needed moving to and from a bed to chair (including a wheelchair)?: A Little Help needed walking in hospital room?: A Little Help needed climbing 3-5 steps with a railing? : A Little 6 Click Score: 20    End of Session Equipment Utilized During Treatment: Gait belt Activity Tolerance: Patient tolerated treatment well Patient left: Other (comment) (sitting EOB) Nurse Communication: Mobility status PT Visit Diagnosis: Unsteadiness on feet (R26.81);Muscle weakness (generalized) (M62.81)    Time: 4315-4008 PT Time Calculation (min) (ACUTE ONLY): 36 min   Charges:   PT Evaluation $PT Eval Low Complexity: 1 Procedure PT Treatments $Gait Training: 8-22 mins   PT G Codes:   PT G-Codes **NOT FOR INPATIENT CLASS** Functional Assessment Tool Used: Clinical judgement Functional Limitation: Mobility: Walking and moving around Mobility: Walking and Moving Around Current Status (Q7619): At least 1 percent but less than 20 percent impaired, limited or restricted Mobility: Walking and Moving Around Goal Status [phone]): At least 1 percent but less than 20 percent impaired, limited or restricted Mobility: Walking and Moving Around Discharge Status [phone]): At least 1 percent but less than 20 percent impaired, limited or restricted    Pg [phone]  [phone]   Jennine Peddy 02/17/2017, 12:28 PM

## 2017-02-17 NOTE — Progress Notes (Signed)
   Subjective: 1 Day Post-Op Procedure(s) (LRB): HARDWARE REMOVAL LEFT KNEE (Left) Patient reports pain as mild.   Plan is to go Home after hospital stay.  Objective: Vital signs in last 24 hours: Temp:  [97.4 F (36.3 C)-98.8 F (37.1 C)] 97.4 F (36.3 C) (06/21 0541) Pulse Rate:  [57-91] 66 (06/21 0541) Resp:  [13-18] 17 (06/21 0541) BP: (103-137)/(43-73) 116/49 (06/21 0541) SpO2:  [95 %-100 %] 98 % (06/21 0541) Weight:  [87.1 kg (192 lb)] 87.1 kg (192 lb) (06/20 1245)  Intake/Output from previous day:  Intake/Output Summary (Last 24 hours) at 02/17/17 0659 Last data filed at 02/17/17 0600  Gross per 24 hour  Intake           4047.5 ml  Output              770 ml  Net           3277.5 ml    Intake/Output this shift: Total I/O In: 1908.8 [P.O.:840; I.V.:868.8; IV Piggyback:200] Out: 40 [Drains:40]  Labs:  Recent Labs  02/16/17 1315  HGB 11.7*    Recent Labs  02/16/17 1315  WBC 12.0*  RBC 3.68*  HCT 34.9*  PLT 384    Recent Labs  02/16/17 1315  CREATININE 0.86   No results for input(s): LABPT, INR in the last 72 hours.  EXAM General - Patient is Alert, Appropriate and Oriented Extremity - Neurologically intact Neurovascular intact No cellulitis present Compartment soft Dressing - dressing C/D/I Motor Function - intact, moving foot and toes well on exam.  Hemovac pulled without difficulty.  Past Medical History:  Diagnosis Date  . Anxiety   . Arthritis   . Claustrophobia   . Hypothyroidism     Assessment/Plan: 1 Day Post-Op Procedure(s) (LRB): HARDWARE REMOVAL LEFT KNEE (Left) Principal Problem:   Presence of retained hardware Active Problems:   OA (osteoarthritis) of knee   Painful orthopaedic hardware ([place])   Advance diet Up with therapy D/C IV fluids  Discharge home; no home health needs; WBAT LLE with walker or crutches  DVT Prophylaxis - Aspirin Weight-Bearing as tolerated to left leg   Gurbani Figge V 02/17/2017, 6:59  AM

## 2017-02-17 NOTE — Discharge Instructions (Signed)
You may remove your large bandage tomorrow and place a clean guaze and tape over the incision. You may begin showering in 2 days. Take the bandage off before showering and place a clean dry bandage after showering. You may get the incision wet in the shower  Take an 81 mg Aspirin daily for the next 4 weeks to prevent blood clots  Increase activity slowly as tolerated. You may bear full weight on the left leg but use crutches or a walker for the first 1-2 weeks until you are comfortable without them

## 2017-02-17 NOTE — Op Note (Signed)
NAME:  [name], [name]                    ACCOUNT NO.:  MEDICAL RECORD NO.:  [id number]  LOCATION:                                 FACILITY:  PHYSICIAN:  Gaynelle Arabian, M.D.    DATE OF BIRTH:  1950-11-24  DATE OF PROCEDURE:  02/16/2017 DATE OF DISCHARGE:                              OPERATIVE REPORT   PREOPERATIVE DIAGNOSIS:  Posttraumatic osteoarthritis, left knee with retained hardware.  POSTOPERATIVE DIAGNOSIS:  Posttraumatic osteoarthritis, left knee with retained hardware.  PROCEDURE:  Hardware removal, left femur/knee.  SURGEON:  Gaynelle Arabian, M.D.  ASSISTANT:  Judith Part. Chabon, P.A.  ANESTHESIA:  General.  ESTIMATED BLOOD LOSS:  Minimal.  DRAINS:  Hemovac x1.  COMPLICATIONS:  None.  TOURNIQUET TIME:  50 minutes at 300 mmHg.  CONDITION:  Stable to recovery.  BRIEF CLINICAL NOTE:  Ms. [name] is a 66 year old female with end-stage arthritis of the left knee, status post distal femur fracture with plating of the distal femur.  She is in need of a total knee arthroplasty and the hardware will be prohibitive for the procedure.  We thus removing the hardware now in preparation for total knee arthroplasty in approximately 2 months.  PROCEDURE IN DETAIL:  After successful administration of general anesthetic, a tourniquet was placed high on her left thigh and left lower extremity, prepped and draped in usual sterile fashion. Extremities wrapped in Esmarch, tourniquet inflated to 300 mmHg.  She had a previous lateral incision over the femur distally at the knee and coursing proximally about 10 inches up the thigh.  Previous incisions were utilized, skin cut with a 10 blade through subcutaneous tissue to the fascia lata, which was incised in line with the skin incision. Distally, the plate was observed proximally.  The muscle was overlying the plate.  There was some scarring in the muscle from where it was previously incised, so went through the scar to get to the  plate.  There were 4 screws proximal and 4 screws distal within the plate.  I was then subsequently able to remove the plate.  There was a DCS plate, which was the compression screw with sideplate.  I was able to remove the plate over the compression screw.  It was another screw that went from lateral to medial and the distal femur and that was also removed.  On the x-ray, the compression screw in the distal femur did not have any thread, so at some point, most of the hardware had broken and that portion been removed.  We threaded the extraction device on to the distal compression screw and we were able to extract it.  All hardware was thus removed. Wound was then copiously irrigated with saline solution.  Tourniquet was then released total time of 50 minutes.  Minor bleeding stopped with cautery.  Deep tissues were closed over a drain with a running #1 V-Loc suture.  Subcu was closed with interrupted 2-0 Vicryl and skin closed with staples.  Incision was cleaned and dried and a sterile dressing applied.  She was then awakened and transported to recovery in stable condition.  Please note that a surgical assistant is a medical necessity for  this procedure for retraction of vital neurovascular structures as well as for assistance in removing the retained hardware especially the distal screw, which was very difficult to remove.     Gaynelle Arabian, M.D.     FA/MEDQ  D:  02/16/2017  T:  02/17/2017  Job:  [id number]

## 2017-02-21 NOTE — Discharge Summary (Signed)
Physician Discharge Summary   Patient ID: [name] MRN: [id number] DOB/AGE: [birth year] 65 y.o.  Admit date: 02/16/2017 Discharge date: 02/17/2017  Primary Diagnosis:   Post-traumatic Oseteoarthritis Left knee  Admission Diagnoses:  Past Medical History:  Diagnosis Date  . Anxiety   . Arthritis   . Claustrophobia   . Hypothyroidism    Discharge Diagnoses:   Principal Problem:   Presence of retained hardware Active Problems:   OA (osteoarthritis) of knee   Painful orthopaedic hardware ([place])  Procedure:  Procedure(s) (LRB): HARDWARE REMOVAL LEFT KNEE (Left)   Consults: None  HPI:   [name] is a 66 year old female with end-stage arthritis of the left knee, status post distal femur fracture with plating of the distal femur.  She is in need of a total knee arthroplasty and the hardware will be prohibitive for the procedure.  We thus removing the hardware now in preparation for total knee arthroplasty in approximately 2 months.    Laboratory Data: Hospital Outpatient Visit on 02/11/2017  Component Date Value Ref Range Status  . WBC 02/11/2017 10.7* 4.0 - 10.5 K/uL Final  . RBC 02/11/2017 3.90  3.87 - 5.11 MIL/uL Final  . Hemoglobin 02/11/2017 12.3  12.0 - 15.0 g/dL Final  . HCT 02/11/2017 37.4  36.0 - 46.0 % Final  . MCV 02/11/2017 95.9  78.0 - 100.0 fL Final  . MCH 02/11/2017 31.5  26.0 - 34.0 pg Final  . MCHC 02/11/2017 32.9  30.0 - 36.0 g/dL Final  . RDW 02/11/2017 12.9  11.5 - 15.5 % Final  . Platelets 02/11/2017 396  150 - 400 K/uL Final  . Sodium 02/11/2017 139  135 - 145 mmol/L Final  . Potassium 02/11/2017 4.1  3.5 - 5.1 mmol/L Final  . Chloride 02/11/2017 102  101 - 111 mmol/L Final  . CO2 02/11/2017 26  22 - 32 mmol/L Final  . Glucose, Bld 02/11/2017 100* 65 - 99 mg/dL Final  . BUN 02/11/2017 12  6 - 20 mg/dL Final  . Creatinine, Ser 02/11/2017 0.84  0.44 - 1.00 mg/dL Final  . Calcium 02/11/2017 9.3  8.9 - 10.3 mg/dL Final  . GFR calc non Af Amer  02/11/2017 >60  >60 mL/min Final  . GFR calc Af Amer 02/11/2017 >60  >60 mL/min Final   Comment: (NOTE) The eGFR has been calculated using the CKD EPI equation. This calculation has not been validated in all clinical situations. eGFR's persistently <60 mL/min signify possible Chronic Kidney Disease.   . Anion gap 02/11/2017 11  5 - 15 Final   No results for input(s): HGB in the last 72 hours. No results for input(s): WBC, RBC, HCT, PLT in the last 72 hours. No results for input(s): NA, K, CL, CO2, BUN, CREATININE, GLUCOSE, CALCIUM in the last 72 hours. No results for input(s): LABPT, INR in the last 72 hours.  X-Rays:No results found.  EKG: Orders placed or performed during the hospital encounter of 02/02/17  . EKG 12-Lead  . EKG 12-Lead     Hospital Course: Patient was admitted to [facility] and taken to the OR and underwent the above state procedure without complications.  Patient tolerated the procedure well and was later transferred to the recovery room and then to the orthopaedic floor for postoperative care.  They were given PO and IV analgesics for pain control following their surgery.  They were given 24 hours of postoperative antibiotics.   PT was consulted postop to assist with mobility and transfers.  The patient was allowed to be WBAT with therapy.  Patient had a good night on the evening of surgery and started to get up OOB with therapy on day one and they walked about 180 feet. Patient was seen in rounds and was ready to go home on day one.  They were given discharge instructions and dressing directions.  They were instructed on when to follow up in the office with Dr. Wynelle Link.   Diet: Regular diet Activity:WBAT Follow-up:in 2 weeks Disposition - Home Discharged Condition: good   Discharge Instructions    Call MD / Call 911    Complete by:  As directed    If you experience chest pain or shortness of breath, CALL 911 and be transported to the hospital  emergency room.  If you develope a fever above 101 F, pus (white drainage) or increased drainage or redness at the wound, or calf pain, call your surgeon's office.   Discharge instructions    Complete by:  As directed    You may remove your large bandage tomorrow and place a clean guaze and tape over the incision. You may begin showering in 2 days. Take the bandage off before showering and place a clean dry bandage after showering. You may get the incision wet in the shower  Take an 81 mg Aspirin daily for the next 4 weeks to prevent blood clots  Increase activity slowly as tolerated. You may bear full weight on the left leg but use crutches or a walker for the first 1-2 weeks until you are comfortable without them   Increase activity slowly as tolerated    Complete by:  As directed      Allergies as of 02/17/2017   No Known Allergies     Medication List    TAKE these medications   HYDROcodone-acetaminophen 5-325 MG tablet Commonly known as:  NORCO Take 1-2 tablets by mouth every 4 (four) hours as needed for moderate pain. What changed:  when to take this   irbesartan 300 MG tablet Commonly known as:  AVAPRO Take 300 mg by mouth daily.   methocarbamol 500 MG tablet Commonly known as:  ROBAXIN Take 1 tablet (500 mg total) by mouth every 6 (six) hours as needed for muscle spasms.   simvastatin 20 MG tablet Commonly known as:  ZOCOR Take 20 mg by mouth daily at 6 PM.   SYNTHROID 100 MCG tablet Generic drug:  levothyroxine Take 100 mcg by mouth daily before breakfast.      Follow-up Information    Gaynelle Arabian, MD. Schedule an appointment as soon as possible for a visit on 02/28/2017.   Specialty:  Orthopedic Surgery Why:  Call [phone] tomorrow for an appointment with Dr. Wynelle Link on 7/2 or 7/3 Contact information: [address] [place] [address] [phone]           Signed: Arlee Muslim, PA-C Orthopaedic Surgery 02/21/2017, 7:16  AM

## 2017-03-01 DIAGNOSIS — M170 Bilateral primary osteoarthritis of knee: Secondary | ICD-10-CM | POA: Diagnosis not present

## 2017-03-09 DIAGNOSIS — Z6833 Body mass index (BMI) 33.0-33.9, adult: Secondary | ICD-10-CM | POA: Diagnosis not present

## 2017-03-09 DIAGNOSIS — Z299 Encounter for prophylactic measures, unspecified: Secondary | ICD-10-CM | POA: Diagnosis not present

## 2017-03-09 DIAGNOSIS — E785 Hyperlipidemia, unspecified: Secondary | ICD-10-CM | POA: Diagnosis not present

## 2017-03-09 DIAGNOSIS — I10 Essential (primary) hypertension: Secondary | ICD-10-CM | POA: Diagnosis not present

## 2017-03-09 DIAGNOSIS — E039 Hypothyroidism, unspecified: Secondary | ICD-10-CM | POA: Diagnosis not present

## 2017-03-09 DIAGNOSIS — Z6835 Body mass index (BMI) 35.0-35.9, adult: Secondary | ICD-10-CM | POA: Diagnosis not present

## 2017-03-21 NOTE — Progress Notes (Signed)
Please place orders in EPIC as patient is being scheduled for a pre-op appointment! Thank you! 

## 2017-03-22 DIAGNOSIS — M1712 Unilateral primary osteoarthritis, left knee: Secondary | ICD-10-CM | POA: Diagnosis not present

## 2017-03-28 ENCOUNTER — Ambulatory Visit: Payer: Self-pay | Admitting: Orthopedic Surgery

## 2017-03-31 DIAGNOSIS — E785 Hyperlipidemia, unspecified: Secondary | ICD-10-CM | POA: Diagnosis not present

## 2017-03-31 DIAGNOSIS — R05 Cough: Secondary | ICD-10-CM | POA: Diagnosis not present

## 2017-03-31 DIAGNOSIS — I10 Essential (primary) hypertension: Secondary | ICD-10-CM | POA: Diagnosis not present

## 2017-03-31 DIAGNOSIS — Z6833 Body mass index (BMI) 33.0-33.9, adult: Secondary | ICD-10-CM | POA: Diagnosis not present

## 2017-03-31 DIAGNOSIS — R350 Frequency of micturition: Secondary | ICD-10-CM | POA: Diagnosis not present

## 2017-03-31 DIAGNOSIS — Z299 Encounter for prophylactic measures, unspecified: Secondary | ICD-10-CM | POA: Diagnosis not present

## 2017-03-31 DIAGNOSIS — E039 Hypothyroidism, unspecified: Secondary | ICD-10-CM | POA: Diagnosis not present

## 2017-04-07 DIAGNOSIS — E039 Hypothyroidism, unspecified: Secondary | ICD-10-CM | POA: Diagnosis not present

## 2017-04-07 DIAGNOSIS — Z6833 Body mass index (BMI) 33.0-33.9, adult: Secondary | ICD-10-CM | POA: Diagnosis not present

## 2017-04-07 DIAGNOSIS — M25569 Pain in unspecified knee: Secondary | ICD-10-CM | POA: Diagnosis not present

## 2017-04-07 DIAGNOSIS — I10 Essential (primary) hypertension: Secondary | ICD-10-CM | POA: Diagnosis not present

## 2017-04-07 DIAGNOSIS — Z299 Encounter for prophylactic measures, unspecified: Secondary | ICD-10-CM | POA: Diagnosis not present

## 2017-04-08 ENCOUNTER — Encounter (HOSPITAL_COMMUNITY)
Admission: RE | Admit: 2017-04-08 | Discharge: 2017-04-08 | Disposition: A | Discharge status: Home / Self Care | Payer: Medicare Other | Source: Ambulatory Visit | Attending: Orthopedic Surgery | Admitting: Orthopedic Surgery

## 2017-04-08 ENCOUNTER — Encounter (INDEPENDENT_AMBULATORY_CARE_PROVIDER_SITE_OTHER): Payer: Self-pay

## 2017-04-08 ENCOUNTER — Encounter (HOSPITAL_COMMUNITY): Payer: Self-pay

## 2017-04-08 DIAGNOSIS — Z01818 Encounter for other preprocedural examination: Secondary | ICD-10-CM | POA: Insufficient documentation

## 2017-04-08 DIAGNOSIS — M1712 Unilateral primary osteoarthritis, left knee: Secondary | ICD-10-CM | POA: Insufficient documentation

## 2017-04-08 LAB — CBC
HCT: 35.1 % — ABNORMAL LOW (ref 36.0–46.0)
Hemoglobin: 11.9 g/dL — ABNORMAL LOW (ref 12.0–15.0)
MCH: 31.6 pg (ref 26.0–34.0)
MCHC: 33.9 g/dL (ref 30.0–36.0)
MCV: 93.1 fL (ref 78.0–100.0)
Platelets: 299 10*3/uL (ref 150–400)
RBC: 3.77 MIL/uL — ABNORMAL LOW (ref 3.87–5.11)
RDW: 12.7 % (ref 11.5–15.5)
WBC: 7.9 10*3/uL (ref 4.0–10.5)

## 2017-04-08 LAB — COMPREHENSIVE METABOLIC PANEL
ALT: 18 U/L (ref 14–54)
AST: 24 U/L (ref 15–41)
Albumin: 4.3 g/dL (ref 3.5–5.0)
Alkaline Phosphatase: 78 U/L (ref 38–126)
Anion gap: 9 (ref 5–15)
BUN: 12 mg/dL (ref 6–20)
CO2: 27 mmol/L (ref 22–32)
Calcium: 9.1 mg/dL (ref 8.9–10.3)
Chloride: 105 mmol/L (ref 101–111)
Creatinine, Ser: 0.87 mg/dL (ref 0.44–1.00)
GFR calc Af Amer: >60 mL/min (ref >60)
GFR calc non Af Amer: >60 mL/min (ref >60)
Glucose, Bld: 96 mg/dL (ref 65–99)
Potassium: 4 mmol/L (ref 3.5–5.1)
Sodium: 141 mmol/L (ref 135–145)
Total Bilirubin: 0.6 mg/dL (ref 0.3–1.2)
Total Protein: 7 g/dL (ref 6.5–8.1)

## 2017-04-08 LAB — APTT: aPTT: 33 seconds (ref 24–36)

## 2017-04-08 LAB — SURGICAL PCR SCREEN
MRSA, PCR: NEGATIVE
Staphylococcus aureus: NEGATIVE

## 2017-04-08 LAB — PROTIME-INR
INR: 1
Prothrombin Time: 13.2 seconds (ref 11.4–15.2)

## 2017-04-08 LAB — ABO/RH: ABO/RH(D): A POS

## 2017-04-08 NOTE — Progress Notes (Signed)
04-07-17 Surgical Clearance from Dr. Woody Seller on chart 02-02-17 [facility]) EKG w/ L Anterior Fasicular Block

## 2017-04-08 NOTE — Patient Instructions (Addendum)
[name]  04/08/2017   Your procedure is scheduled on: 04-18-17  Report to [facility] Main  Entrance Report to Admitting at 12:20 PM   Call this number if you have problems the morning of surgery [phone]    Remember: ONLY 1 PERSON MAY GO WITH YOU TO SHORT STAY TO GET  READY MORNING OF [place].  Do not eat food or drink liquids :After Midnight. You may have a Clear Liquid Diet from midnight until 08:50 AM. After 8:50AM, nothing by mouth.     CLEAR LIQUID DIET   Foods Allowed                                                                     Foods Excluded  Coffee and tea, regular and decaf                             liquids that you cannot  Plain Jell-O in any flavor                                             see through such as: Fruit ices (not with fruit pulp)                                     milk, soups, orange juice  Iced Popsicles                                    All solid food Carbonated beverages, regular and diet                                    Cranberry, grape and apple juices Sports drinks like Gatorade Lightly seasoned clear broth or consume(fat free) Sugar, honey syrup  Sample Menu Breakfast                                Lunch                                     Supper Cranberry juice                    Beef broth                            Chicken broth Jell-O                                     Grape juice  Apple juice Coffee or tea                        Jell-O                                      Popsicle                                                Coffee or tea                        Coffee or tea  _____________________________________________________________________     Take these medicines the morning of surgery with A SIP OF WATER: Synthroid                                You may not have any metal on your body including hair pins and              piercings  Do not wear  jewelry, make-up, lotions, powders or perfumes, deodorant             Do not wear nail polish.  Do not shave  48 hours prior to surgery.              Men may shave face and neck.   Do not bring valuables to the hospital. [place].  Contacts, dentures or bridgework may not be worn into surgery.  Leave suitcase in the car. After surgery it may be brought to your room.                Please read over the following fact sheets you were given: _____________________________________________________________________             [facility] - Preparing for Surgery Before surgery, you can play an important role.  Because skin is not sterile, your skin needs to be as free of germs as possible.  You can reduce the number of germs on your skin by washing with CHG (chlorahexidine gluconate) soap before surgery.  CHG is an antiseptic cleaner which kills germs and bonds with the skin to continue killing germs even after washing. Please DO NOT use if you have an allergy to CHG or antibacterial soaps.  If your skin becomes reddened/irritated stop using the CHG and inform your nurse when you arrive at Short Stay. Do not shave (including legs and underarms) for at least 48 hours prior to the first CHG shower.  You may shave your face/neck. Please follow these instructions carefully:  1.  Shower with CHG Soap the night before surgery and the  morning of Surgery.  2.  If you choose to wash your hair, wash your hair first as usual with your  normal  shampoo.  3.  After you shampoo, rinse your hair and body thoroughly to remove the  shampoo.                           4.  Use CHG as you would any other liquid soap.  You can apply chg directly  to the skin and wash                       Gently with a scrungie or clean washcloth.  5.  Apply the CHG Soap to your body ONLY FROM THE NECK DOWN.   Do not use on face/ open                           Wound or open sores. Avoid  contact with eyes, ears mouth and genitals (private parts).                       Wash face,  Genitals (private parts) with your normal soap.             6.  Wash thoroughly, paying special attention to the area where your surgery  will be performed.  7.  Thoroughly rinse your body with warm water from the neck down.  8.  DO NOT shower/wash with your normal soap after using and rinsing off  the CHG Soap.                9.  Pat yourself dry with a clean towel.            10.  Wear clean pajamas.            11.  Place clean sheets on your bed the night of your first shower and do not  sleep with pets. Day of Surgery : Do not apply any lotions/deodorants the morning of surgery.  Please wear clean clothes to the hospital/surgery center.  FAILURE TO FOLLOW THESE INSTRUCTIONS MAY RESULT IN THE CANCELLATION OF YOUR SURGERY PATIENT SIGNATURE_________________________________  NURSE SIGNATURE__________________________________  ________________________________________________________________________   [name]  An incentive spirometer is a tool that can help keep your lungs clear and active. This tool measures how well you are filling your lungs with each breath. Taking long deep breaths may help reverse or decrease the chance of developing breathing (pulmonary) problems (especially infection) following:  A long period of time when you are unable to move or be active. BEFORE THE PROCEDURE   If the spirometer includes an indicator to show your best effort, your nurse or respiratory therapist will set it to a desired goal.  If possible, sit up straight or lean slightly forward. Try not to slouch.  Hold the incentive spirometer in an upright position. INSTRUCTIONS FOR USE  1. Sit on the edge of your bed if possible, or sit up as far as you can in bed or on a chair. 2. Hold the incentive spirometer in an upright position. 3. Breathe out normally. 4. Place the mouthpiece in your mouth  and seal your lips tightly around it. 5. Breathe in slowly and as deeply as possible, raising the piston or the ball toward the top of the column. 6. Hold your breath for 3-5 seconds or for as long as possible. Allow the piston or ball to fall to the bottom of the column. 7. Remove the mouthpiece from your mouth and breathe out normally. 8. Rest for a few seconds and repeat Steps 1 through 7 at least 10 times every 1-2 hours when you are awake. Take your time and take a few normal breaths between deep breaths. 9. The spirometer may include an indicator to show your best effort. Use the indicator as a goal to work  toward during each repetition. 10. After each set of 10 deep breaths, practice coughing to be sure your lungs are clear. If you have an incision (the cut made at the time of surgery), support your incision when coughing by placing a pillow or rolled up towels firmly against it. Once you are able to get out of bed, walk around indoors and cough well. You may stop using the incentive spirometer when instructed by your caregiver.  RISKS AND COMPLICATIONS  Take your time so you do not get dizzy or light-headed.  If you are in pain, you may need to take or ask for pain medication before doing incentive spirometry. It is harder to take a deep breath if you are having pain. AFTER USE  Rest and breathe slowly and easily.  It can be helpful to keep track of a log of your progress. Your caregiver can provide you with a simple table to help with this. If you are using the spirometer at home, follow these instructions: [place] IF:   You are having difficultly using the spirometer.  You have trouble using the spirometer as often as instructed.  Your pain medication is not giving enough relief while using the spirometer.  You develop fever of 100.5 F (38.1 C) or higher. SEEK IMMEDIATE MEDICAL CARE IF:   You cough up bloody sputum that had not been present before.  You develop  fever of 102 F (38.9 C) or greater.  You develop worsening pain at or near the incision site. MAKE SURE YOU:   Understand these instructions.  Will watch your condition.  Will get help right away if you are not doing well or get worse. Document Released: 12/27/2006 Document Revised: 11/08/2011 Document Reviewed: 02/27/2007 ExitCare Patient Information 2014 ExitCare, [place].   ________________________________________________________________________  WHAT IS A BLOOD TRANSFUSION? Blood Transfusion Information  A transfusion is the replacement of blood or some of its parts. Blood is made up of multiple cells which provide different functions.  Red blood cells carry oxygen and are used for blood loss replacement.  White blood cells fight against infection.  Platelets control bleeding.  Plasma helps clot blood.  Other blood products are available for specialized needs, such as hemophilia or other clotting disorders. BEFORE THE TRANSFUSION  Who gives blood for transfusions?   Healthy volunteers who are fully evaluated to make sure their blood is safe. This is blood bank blood. Transfusion therapy is the safest it has ever been in the practice of medicine. Before blood is taken from a donor, a complete history is taken to make sure that person has no history of diseases nor engages in risky social behavior (examples are intravenous drug use or sexual activity with multiple partners). The donor's travel history is screened to minimize risk of transmitting infections, such as malaria. The donated blood is tested for signs of infectious diseases, such as HIV and hepatitis. The blood is then tested to be sure it is compatible with you in order to minimize the chance of a transfusion reaction. If you or a relative donates blood, this is often done in anticipation of surgery and is not appropriate for emergency situations. It takes many days to process the donated blood. RISKS AND  COMPLICATIONS Although transfusion therapy is very safe and saves many lives, the main dangers of transfusion include:   Getting an infectious disease.  Developing a transfusion reaction. This is an allergic reaction to something in the blood you were given. Every precaution is taken  to prevent this. The decision to have a blood transfusion has been considered carefully by your caregiver before blood is given. Blood is not given unless the benefits outweigh the risks. AFTER THE TRANSFUSION  Right after receiving a blood transfusion, you will usually feel much better and more energetic. This is especially true if your red blood cells have gotten low (anemic). The transfusion raises the level of the red blood cells which carry oxygen, and this usually causes an energy increase.  The nurse administering the transfusion will monitor you carefully for complications. HOME CARE INSTRUCTIONS  No special instructions are needed after a transfusion. You may find your energy is better. Speak with your caregiver about any limitations on activity for underlying diseases you may have. SEEK MEDICAL CARE IF:   Your condition is not improving after your transfusion.  You develop redness or irritation at the intravenous (IV) site. SEEK IMMEDIATE MEDICAL CARE IF:  Any of the following symptoms occur over the next 12 hours:  Shaking chills.  You have a temperature by mouth above 102 F (38.9 C), not controlled by medicine.  Chest, back, or muscle pain.  People around you feel you are not acting correctly or are confused.  Shortness of breath or difficulty breathing.  Dizziness and fainting.  You get a rash or develop hives.  You have a decrease in urine output.  Your urine turns a dark color or changes to pink, red, or brown. Any of the following symptoms occur over the next 10 days:  You have a temperature by mouth above 102 F (38.9 C), not controlled by medicine.  Shortness of  breath.  Weakness after normal activity.  The white part of the eye turns yellow (jaundice).  You have a decrease in the amount of urine or are urinating less often.  Your urine turns a dark color or changes to pink, red, or brown. Document Released: 08/13/2000 Document Revised: 11/08/2011 Document Reviewed: 04/01/2008 [facility] Patient Information 2014 [place], [place].  _______________________________________________________________________

## 2017-04-11 DIAGNOSIS — Z1212 Encounter for screening for malignant neoplasm of rectum: Secondary | ICD-10-CM | POA: Diagnosis not present

## 2017-04-11 DIAGNOSIS — K648 Other hemorrhoids: Secondary | ICD-10-CM | POA: Diagnosis not present

## 2017-04-11 DIAGNOSIS — Z124 Encounter for screening for malignant neoplasm of cervix: Secondary | ICD-10-CM | POA: Diagnosis not present

## 2017-04-11 DIAGNOSIS — N952 Postmenopausal atrophic vaginitis: Secondary | ICD-10-CM | POA: Diagnosis not present

## 2017-04-13 ENCOUNTER — Ambulatory Visit: Payer: Self-pay | Admitting: Orthopedic Surgery
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
## 2017-04-17 NOTE — Anesthesia Preprocedure Evaluation (Addendum)
Anesthesia Evaluation  Patient identified by MRN, date of birth, ID band Patient awake    Reviewed: Allergy & Precautions, H&P , NPO status , Patient's Chart, lab work & pertinent test results  Airway Mallampati: I  TM Distance: >3 FB Neck ROM: full    Dental no notable dental hx.    Pulmonary neg pulmonary ROS,    Pulmonary exam normal breath sounds clear to auscultation       Cardiovascular Exercise Tolerance: Good Normal cardiovascular exam Rhythm:regular Rate:Normal     Neuro/Psych Anxiety negative neurological ROS     GI/Hepatic negative GI ROS, Neg liver ROS,   Endo/Other  Hypothyroidism   Renal/GU negative Renal ROS  negative genitourinary   Musculoskeletal  (+) Arthritis ,   Abdominal (+) + obese,   Peds  Hematology negative hematology ROS (+)   Anesthesia Other Findings   Reproductive/Obstetrics negative OB ROS                             Anesthesia Physical  Anesthesia Plan  ASA: II  Anesthesia Plan: General   Post-op Pain Management:  Regional for Post-op pain   Induction:   PONV Risk Score and Plan: 3 and Ondansetron, Dexamethasone, Midazolam, Treatment may vary due to age or medical condition and Propofol infusion  Airway Management Planned: LMA  Additional Equipment:   Intra-op Plan:   Post-operative Plan:   Informed Consent: I have reviewed the patients History and Physical, chart, labs and discussed the procedure including the risks, benefits and alternatives for the proposed anesthesia with the patient or authorized representative who has indicated his/her understanding and acceptance.   Dental Advisory Given  Plan Discussed with: CRNA and Surgeon  Anesthesia Plan Comments:        Anesthesia Quick Evaluation

## 2017-04-18 ENCOUNTER — Encounter (HOSPITAL_COMMUNITY)
Admission: RE | Disposition: A | Discharge status: Home Health | Payer: Self-pay | Source: Ambulatory Visit | Attending: Orthopedic Surgery

## 2017-04-18 ENCOUNTER — Inpatient Hospital Stay (HOSPITAL_COMMUNITY): Payer: Medicare Other | Admitting: Anesthesiology

## 2017-04-18 ENCOUNTER — Inpatient Hospital Stay (HOSPITAL_COMMUNITY)
Admission: RE | Admit: 2017-04-18 | Discharge: 2017-04-20 | DRG: 470 | Disposition: A | Discharge status: Home Health | Payer: Medicare Other | Source: Ambulatory Visit | Attending: Orthopedic Surgery | Admitting: Orthopedic Surgery

## 2017-04-18 ENCOUNTER — Encounter (HOSPITAL_COMMUNITY): Payer: Self-pay

## 2017-04-18 DIAGNOSIS — S72402S Unspecified fracture of lower end of left femur, sequela: Secondary | ICD-10-CM

## 2017-04-18 DIAGNOSIS — Z7982 Long term (current) use of aspirin: Secondary | ICD-10-CM

## 2017-04-18 DIAGNOSIS — G8929 Other chronic pain: Secondary | ICD-10-CM | POA: Diagnosis not present

## 2017-04-18 DIAGNOSIS — F40240 Claustrophobia: Secondary | ICD-10-CM | POA: Diagnosis present

## 2017-04-18 DIAGNOSIS — E7800 Pure hypercholesterolemia, unspecified: Secondary | ICD-10-CM | POA: Diagnosis not present

## 2017-04-18 DIAGNOSIS — Z8261 Family history of arthritis: Secondary | ICD-10-CM

## 2017-04-18 DIAGNOSIS — K8010 Calculus of gallbladder with chronic cholecystitis without obstruction: Secondary | ICD-10-CM | POA: Diagnosis not present

## 2017-04-18 DIAGNOSIS — M172 Bilateral post-traumatic osteoarthritis of knee: Secondary | ICD-10-CM | POA: Diagnosis not present

## 2017-04-18 DIAGNOSIS — Z8249 Family history of ischemic heart disease and other diseases of the circulatory system: Secondary | ICD-10-CM

## 2017-04-18 DIAGNOSIS — Z791 Long term (current) use of non-steroidal anti-inflammatories (NSAID): Secondary | ICD-10-CM | POA: Diagnosis not present

## 2017-04-18 DIAGNOSIS — M1712 Unilateral primary osteoarthritis, left knee: Secondary | ICD-10-CM | POA: Diagnosis not present

## 2017-04-18 DIAGNOSIS — M1732 Unilateral post-traumatic osteoarthritis, left knee: Secondary | ICD-10-CM | POA: Diagnosis not present

## 2017-04-18 DIAGNOSIS — E039 Hypothyroidism, unspecified: Secondary | ICD-10-CM | POA: Diagnosis not present

## 2017-04-18 DIAGNOSIS — Z888 Allergy status to other drugs, medicaments and biological substances status: Secondary | ICD-10-CM | POA: Diagnosis not present

## 2017-04-18 DIAGNOSIS — S72401S Unspecified fracture of lower end of right femur, sequela: Secondary | ICD-10-CM | POA: Diagnosis not present

## 2017-04-18 DIAGNOSIS — M1710 Unilateral primary osteoarthritis, unspecified knee: Secondary | ICD-10-CM | POA: Diagnosis present

## 2017-04-18 DIAGNOSIS — M179 Osteoarthritis of knee, unspecified: Secondary | ICD-10-CM | POA: Diagnosis present

## 2017-04-18 DIAGNOSIS — G8918 Other acute postprocedural pain: Secondary | ICD-10-CM | POA: Diagnosis not present

## 2017-04-18 HISTORY — PX: TOTAL KNEE ARTHROPLASTY: SHX125

## 2017-04-18 LAB — TYPE AND SCREEN
ABO/RH(D): A POS
Antibody Screen: NEGATIVE

## 2017-04-18 SURGERY — ARTHROPLASTY, KNEE, TOTAL
Anesthesia: General | Site: Knee | Laterality: Left

## 2017-04-18 MED ORDER — RIVAROXABAN 10 MG PO TABS
10.0000 mg | ORAL_TABLET | Freq: Every day | ORAL | Status: DC
  Administered 2017-04-19 – 2017-04-20 (×2): 10 mg via ORAL
  Filled 2017-04-18 (×2): qty 1

## 2017-04-18 MED ORDER — SODIUM CHLORIDE 0.9 % IJ SOLN
INTRAMUSCULAR | Status: DC | PRN
  Administered 2017-04-18: 60 mL

## 2017-04-18 MED ORDER — MIDAZOLAM HCL 2 MG/2ML IJ SOLN
INTRAMUSCULAR | Status: AC
  Administered 2017-04-18: 2 mg via INTRAVENOUS
  Filled 2017-04-18: qty 2

## 2017-04-18 MED ORDER — GABAPENTIN 300 MG PO CAPS
300.0000 mg | ORAL_CAPSULE | Freq: Once | ORAL | Status: AC
  Administered 2017-04-18: 300 mg via ORAL

## 2017-04-18 MED ORDER — METOCLOPRAMIDE HCL 5 MG PO TABS
5.0000 mg | ORAL_TABLET | Freq: Three times a day (TID) | ORAL | Status: DC | PRN
Start: 2017-04-18 — End: 2017-04-20

## 2017-04-18 MED ORDER — SODIUM CHLORIDE 0.9 % IR SOLN
Status: DC | PRN
  Administered 2017-04-18: 1000 mL

## 2017-04-18 MED ORDER — LEVOTHYROXINE SODIUM 100 MCG PO TABS
100.0000 ug | ORAL_TABLET | Freq: Every day | ORAL | Status: DC
  Filled 2017-04-18: qty 1

## 2017-04-18 MED ORDER — SODIUM CHLORIDE 0.9 % IJ SOLN
INTRAMUSCULAR | Status: AC
  Filled 2017-04-18: qty 10

## 2017-04-18 MED ORDER — DEXAMETHASONE SODIUM PHOSPHATE 10 MG/ML IJ SOLN
INTRAMUSCULAR | Status: AC
  Filled 2017-04-18: qty 1

## 2017-04-18 MED ORDER — LACTATED RINGERS IV SOLN
INTRAVENOUS | Status: DC
  Administered 2017-04-18: 17:00:00 via INTRAVENOUS
  Administered 2017-04-18: 1000 mL via INTRAVENOUS
  Administered 2017-04-18: 16:00:00 via INTRAVENOUS

## 2017-04-18 MED ORDER — MEPERIDINE HCL 50 MG/ML IJ SOLN: 6.2500 mg | INTRAMUSCULAR | Status: DC | PRN

## 2017-04-18 MED ORDER — BUPIVACAINE IN DEXTROSE 0.75-8.25 % IT SOLN
INTRATHECAL | Status: DC | PRN
  Administered 2017-04-18: 2 mL via INTRATHECAL

## 2017-04-18 MED ORDER — FENTANYL CITRATE (PF) 100 MCG/2ML IJ SOLN: 25.0000 ug | INTRAMUSCULAR | Status: DC | PRN

## 2017-04-18 MED ORDER — MIDAZOLAM HCL 2 MG/2ML IJ SOLN
2.0000 mg | Freq: Once | INTRAMUSCULAR | Status: AC
  Administered 2017-04-18: 2 mg via INTRAVENOUS

## 2017-04-18 MED ORDER — ACETAMINOPHEN 10 MG/ML IV SOLN
1000.0000 mg | Freq: Once | INTRAVENOUS | Status: AC
  Administered 2017-04-18: 1000 mg via INTRAVENOUS

## 2017-04-18 MED ORDER — STERILE WATER FOR IRRIGATION IR SOLN
Status: DC | PRN
  Administered 2017-04-18: 2000 mL

## 2017-04-18 MED ORDER — MIDAZOLAM HCL 2 MG/2ML IJ SOLN
INTRAMUSCULAR | Status: AC
  Filled 2017-04-18: qty 2

## 2017-04-18 MED ORDER — ACETAMINOPHEN 650 MG RE SUPP: 650.0000 mg | Freq: Four times a day (QID) | RECTAL | Status: DC | PRN

## 2017-04-18 MED ORDER — DEXAMETHASONE SODIUM PHOSPHATE 10 MG/ML IJ SOLN
10.0000 mg | Freq: Once | INTRAMUSCULAR | Status: AC
  Administered 2017-04-18: 10 mg via INTRAVENOUS

## 2017-04-18 MED ORDER — FENTANYL CITRATE (PF) 100 MCG/2ML IJ SOLN
100.0000 ug | Freq: Once | INTRAMUSCULAR | Status: AC
  Administered 2017-04-18 (×2): 50 ug via INTRAVENOUS

## 2017-04-18 MED ORDER — BUPIVACAINE HCL (PF) 0.75 % IJ SOLN
INTRAMUSCULAR | Status: DC | PRN
  Administered 2017-04-18: 20 mL via PERINEURAL

## 2017-04-18 MED ORDER — METHOCARBAMOL 1000 MG/10ML IJ SOLN
500.0000 mg | Freq: Four times a day (QID) | INTRAVENOUS | Status: DC | PRN
  Administered 2017-04-18: 500 mg via INTRAVENOUS
  Filled 2017-04-18: qty 5

## 2017-04-18 MED ORDER — CEFAZOLIN SODIUM-DEXTROSE 2-4 GM/100ML-% IV SOLN
2.0000 g | Freq: Four times a day (QID) | INTRAVENOUS | Status: AC
  Administered 2017-04-18 – 2017-04-19 (×2): 2 g via INTRAVENOUS
  Filled 2017-04-18 (×2): qty 100

## 2017-04-18 MED ORDER — ONDANSETRON HCL 4 MG PO TABS: 4.0000 mg | ORAL_TABLET | Freq: Four times a day (QID) | ORAL | Status: DC | PRN

## 2017-04-18 MED ORDER — IRBESARTAN 300 MG PO TABS
300.0000 mg | ORAL_TABLET | Freq: Every day | ORAL | Status: DC
  Administered 2017-04-19: 16:00:00 300 mg via ORAL
  Filled 2017-04-18: qty 2
  Filled 2017-04-18 (×2): qty 1

## 2017-04-18 MED ORDER — TRANEXAMIC ACID 1000 MG/10ML IV SOLN
1000.0000 mg | Freq: Once | INTRAVENOUS | Status: AC
  Administered 2017-04-18: 1000 mg via INTRAVENOUS
  Filled 2017-04-18: qty 1100

## 2017-04-18 MED ORDER — METOCLOPRAMIDE HCL 5 MG/ML IJ SOLN: 5.0000 mg | Freq: Three times a day (TID) | INTRAMUSCULAR | Status: DC | PRN

## 2017-04-18 MED ORDER — 0.9 % SODIUM CHLORIDE (POUR BTL) OPTIME
TOPICAL | Status: DC | PRN
  Administered 2017-04-18: 1000 mL

## 2017-04-18 MED ORDER — SODIUM CHLORIDE 0.9 % IV SOLN
INTRAVENOUS | Status: DC
  Administered 2017-04-18: 19:00:00 via INTRAVENOUS

## 2017-04-18 MED ORDER — ONDANSETRON HCL 4 MG/2ML IJ SOLN: 4.0000 mg | Freq: Four times a day (QID) | INTRAMUSCULAR | Status: DC | PRN

## 2017-04-18 MED ORDER — ONDANSETRON HCL 4 MG/2ML IJ SOLN
INTRAMUSCULAR | Status: DC | PRN
  Administered 2017-04-18: 4 mg via INTRAVENOUS

## 2017-04-18 MED ORDER — CEFAZOLIN SODIUM-DEXTROSE 2-4 GM/100ML-% IV SOLN
2.0000 g | INTRAVENOUS | Status: AC
  Administered 2017-04-18: 2 g via INTRAVENOUS

## 2017-04-18 MED ORDER — FLEET ENEMA 7-19 GM/118ML RE ENEM: 1.0000 | ENEMA | Freq: Once | RECTAL | Status: DC | PRN

## 2017-04-18 MED ORDER — PHENOL 1.4 % MT LIQD: 1.0000 | OROMUCOSAL | Status: DC | PRN

## 2017-04-18 MED ORDER — CHLORHEXIDINE GLUCONATE 4 % EX LIQD: 60.0000 mL | Freq: Once | CUTANEOUS | Status: DC

## 2017-04-18 MED ORDER — OXYCODONE HCL 5 MG PO TABS
5.0000 mg | ORAL_TABLET | ORAL | Status: DC | PRN
  Administered 2017-04-18: 23:00:00 10 mg via ORAL
  Administered 2017-04-18 (×2): 5 mg via ORAL
  Administered 2017-04-19 – 2017-04-20 (×8): 10 mg via ORAL
  Filled 2017-04-18: qty 2
  Filled 2017-04-18: qty 1
  Filled 2017-04-18: qty 2
  Filled 2017-04-18 (×2): qty 1
  Filled 2017-04-18 (×2): qty 2
  Filled 2017-04-18: qty 1
  Filled 2017-04-18 (×4): qty 2

## 2017-04-18 MED ORDER — MIDAZOLAM HCL 5 MG/5ML IJ SOLN
INTRAMUSCULAR | Status: DC | PRN
  Administered 2017-04-18: 2 mg via INTRAVENOUS

## 2017-04-18 MED ORDER — METHOCARBAMOL 500 MG PO TABS
500.0000 mg | ORAL_TABLET | Freq: Four times a day (QID) | ORAL | Status: DC | PRN
  Administered 2017-04-19 (×2): 500 mg via ORAL
  Filled 2017-04-18 (×2): qty 1

## 2017-04-18 MED ORDER — TRAMADOL HCL 50 MG PO TABS: 50.0000 mg | ORAL_TABLET | Freq: Four times a day (QID) | ORAL | Status: DC | PRN

## 2017-04-18 MED ORDER — GABAPENTIN 300 MG PO CAPS
ORAL_CAPSULE | ORAL | Status: AC
  Filled 2017-04-18: qty 1

## 2017-04-18 MED ORDER — FENTANYL CITRATE (PF) 100 MCG/2ML IJ SOLN
INTRAMUSCULAR | Status: AC
  Administered 2017-04-18: 50 ug via INTRAVENOUS
  Filled 2017-04-18: qty 2

## 2017-04-18 MED ORDER — DEXAMETHASONE SODIUM PHOSPHATE 10 MG/ML IJ SOLN
10.0000 mg | Freq: Once | INTRAMUSCULAR | Status: AC
  Administered 2017-04-19: 10 mg via INTRAVENOUS
  Filled 2017-04-18: qty 1

## 2017-04-18 MED ORDER — TRANEXAMIC ACID 1000 MG/10ML IV SOLN
1000.0000 mg | INTRAVENOUS | Status: AC
  Administered 2017-04-18: 1000 mg via INTRAVENOUS
  Filled 2017-04-18: qty 1100

## 2017-04-18 MED ORDER — CEFAZOLIN SODIUM-DEXTROSE 2-4 GM/100ML-% IV SOLN
INTRAVENOUS | Status: AC
  Filled 2017-04-18: qty 100

## 2017-04-18 MED ORDER — ACETAMINOPHEN 500 MG PO TABS
1000.0000 mg | ORAL_TABLET | Freq: Four times a day (QID) | ORAL | Status: AC
  Administered 2017-04-18 – 2017-04-19 (×4): 1000 mg via ORAL
  Filled 2017-04-18 (×4): qty 2

## 2017-04-18 MED ORDER — PROPOFOL 10 MG/ML IV BOLUS
INTRAVENOUS | Status: AC
  Filled 2017-04-18: qty 40

## 2017-04-18 MED ORDER — DIPHENHYDRAMINE HCL 12.5 MG/5ML PO ELIX: 12.5000 mg | ORAL_SOLUTION | ORAL | Status: DC | PRN

## 2017-04-18 MED ORDER — SODIUM CHLORIDE 0.9 % IJ SOLN
INTRAMUSCULAR | Status: AC
  Filled 2017-04-18: qty 50

## 2017-04-18 MED ORDER — PHENYLEPHRINE 40 MCG/ML (10ML) SYRINGE FOR IV PUSH (FOR BLOOD PRESSURE SUPPORT)
PREFILLED_SYRINGE | INTRAVENOUS | Status: AC
  Filled 2017-04-18: qty 10

## 2017-04-18 MED ORDER — POLYETHYLENE GLYCOL 3350 17 G PO PACK: 17.0000 g | PACK | Freq: Every day | ORAL | Status: DC | PRN

## 2017-04-18 MED ORDER — FENTANYL CITRATE (PF) 100 MCG/2ML IJ SOLN
INTRAMUSCULAR | Status: AC
  Filled 2017-04-18: qty 4

## 2017-04-18 MED ORDER — ACETAMINOPHEN 325 MG PO TABS: 650.0000 mg | ORAL_TABLET | Freq: Four times a day (QID) | ORAL | Status: DC | PRN

## 2017-04-18 MED ORDER — LIDOCAINE 2% (20 MG/ML) 5 ML SYRINGE
INTRAMUSCULAR | Status: DC | PRN
  Administered 2017-04-18: 50 mg via INTRAVENOUS

## 2017-04-18 MED ORDER — BISACODYL 10 MG RE SUPP: 10.0000 mg | Freq: Every day | RECTAL | Status: DC | PRN

## 2017-04-18 MED ORDER — DOCUSATE SODIUM 100 MG PO CAPS
100.0000 mg | ORAL_CAPSULE | Freq: Two times a day (BID) | ORAL | Status: DC
  Administered 2017-04-18 – 2017-04-20 (×4): 100 mg via ORAL
  Filled 2017-04-18 (×4): qty 1

## 2017-04-18 MED ORDER — ONDANSETRON HCL 4 MG/2ML IJ SOLN
INTRAMUSCULAR | Status: AC
  Filled 2017-04-18: qty 2

## 2017-04-18 MED ORDER — SIMVASTATIN 20 MG PO TABS
20.0000 mg | ORAL_TABLET | Freq: Every day | ORAL | Status: DC
  Administered 2017-04-18 – 2017-04-19 (×2): 20 mg via ORAL
  Filled 2017-04-18 (×2): qty 1

## 2017-04-18 MED ORDER — EPHEDRINE SULFATE-NACL 50-0.9 MG/10ML-% IV SOSY
PREFILLED_SYRINGE | INTRAVENOUS | Status: DC | PRN
  Administered 2017-04-18: 80 mg via INTRAVENOUS

## 2017-04-18 MED ORDER — METOPROLOL SUCCINATE ER 50 MG PO TB24
50.0000 mg | ORAL_TABLET | Freq: Every evening | ORAL | Status: DC
  Administered 2017-04-18 – 2017-04-19 (×2): 50 mg via ORAL
  Filled 2017-04-18 (×2): qty 1

## 2017-04-18 MED ORDER — PROPOFOL 500 MG/50ML IV EMUL
INTRAVENOUS | Status: DC | PRN
  Administered 2017-04-18: 75 ug/kg/min via INTRAVENOUS

## 2017-04-18 MED ORDER — ACETAMINOPHEN 10 MG/ML IV SOLN
INTRAVENOUS | Status: AC
  Filled 2017-04-18: qty 100

## 2017-04-18 MED ORDER — BUPIVACAINE LIPOSOME 1.3 % IJ SUSP
INTRAMUSCULAR | Status: DC | PRN
  Administered 2017-04-18: 20 mL

## 2017-04-18 MED ORDER — MENTHOL 3 MG MT LOZG: 1.0000 | LOZENGE | OROMUCOSAL | Status: DC | PRN

## 2017-04-18 MED ORDER — PHENYLEPHRINE 40 MCG/ML (10ML) SYRINGE FOR IV PUSH (FOR BLOOD PRESSURE SUPPORT)
PREFILLED_SYRINGE | INTRAVENOUS | Status: DC | PRN
  Administered 2017-04-18 (×4): 80 ug via INTRAVENOUS

## 2017-04-18 MED ORDER — PROPOFOL 10 MG/ML IV BOLUS
INTRAVENOUS | Status: AC
  Filled 2017-04-18: qty 20

## 2017-04-18 MED ORDER — BUPIVACAINE LIPOSOME 1.3 % IJ SUSP
20.0000 mL | Freq: Once | INTRAMUSCULAR | Status: DC
  Filled 2017-04-18: qty 20

## 2017-04-18 MED ORDER — MORPHINE SULFATE (PF) 4 MG/ML IV SOLN: 1.0000 mg | INTRAVENOUS | Status: DC | PRN

## 2017-04-18 MED ORDER — LIDOCAINE 2% (20 MG/ML) 5 ML SYRINGE
INTRAMUSCULAR | Status: AC
  Filled 2017-04-18: qty 5

## 2017-04-18 SURGICAL SUPPLY — 51 items
BAG DECANTER FOR FLEXI CONT (MISCELLANEOUS) ×1 IMPLANT
BAG SPEC THK2 15X12 ZIP CLS (MISCELLANEOUS) ×1
BAG ZIPLOCK 12X15 (MISCELLANEOUS) ×3 IMPLANT
BANDAGE ACE 6X5 VEL STRL LF (GAUZE/BANDAGES/DRESSINGS) ×3 IMPLANT
BLADE SAG 18X100X1.27 (BLADE) ×3 IMPLANT
BLADE SAW SGTL 11.0X1.19X90.0M (BLADE) ×3 IMPLANT
BOWL SMART MIX CTS (DISPOSABLE) ×3 IMPLANT
CAP KNEE TOTAL 3 SIGMA ×2 IMPLANT
CEMENT HV SMART SET (Cement) ×6 IMPLANT
CLOSURE WOUND 1/2 X4 (GAUZE/BANDAGES/DRESSINGS) ×2
COVER SURGICAL LIGHT HANDLE (MISCELLANEOUS) ×3 IMPLANT
CUFF TOURN SGL QUICK 34 (TOURNIQUET CUFF) ×3
CUFF TRNQT CYL 34X4X40X1 (TOURNIQUET CUFF) ×1 IMPLANT
DECANTER SPIKE VIAL GLASS SM (MISCELLANEOUS) ×3 IMPLANT
DRAPE U-SHAPE 47X51 STRL (DRAPES) ×3 IMPLANT
DRSG ADAPTIC 3X8 NADH LF (GAUZE/BANDAGES/DRESSINGS) ×3 IMPLANT
DRSG PAD ABDOMINAL 8X10 ST (GAUZE/BANDAGES/DRESSINGS) ×3 IMPLANT
DURAPREP 26ML APPLICATOR (WOUND CARE) ×3 IMPLANT
ELECT REM PT RETURN 15FT ADLT (MISCELLANEOUS) ×3 IMPLANT
EVACUATOR 1/8 PVC DRAIN (DRAIN) ×3 IMPLANT
GAUZE SPONGE 4X4 12PLY STRL (GAUZE/BANDAGES/DRESSINGS) ×3 IMPLANT
GLOVE BIO SURGEON STRL SZ7.5 (GLOVE) ×2 IMPLANT
GLOVE BIO SURGEON STRL SZ8 (GLOVE) ×3 IMPLANT
GLOVE BIOGEL PI IND STRL 6.5 (GLOVE) IMPLANT
GLOVE BIOGEL PI IND STRL 8 (GLOVE) ×1 IMPLANT
GLOVE BIOGEL PI INDICATOR 6.5 (GLOVE)
GLOVE BIOGEL PI INDICATOR 8 (GLOVE) ×2
GLOVE SURG SS PI 6.5 STRL IVOR (GLOVE) IMPLANT
GOWN STRL REUS W/TWL LRG LVL3 (GOWN DISPOSABLE) ×3 IMPLANT
GOWN STRL REUS W/TWL XL LVL3 (GOWN DISPOSABLE) ×2 IMPLANT
HANDPIECE INTERPULSE COAX TIP (DISPOSABLE) ×3
IMMOBILIZER KNEE 20 (SOFTGOODS) ×3
IMMOBILIZER KNEE 20 THIGH 36 (SOFTGOODS) ×1 IMPLANT
MANIFOLD NEPTUNE II (INSTRUMENTS) ×3 IMPLANT
NS IRRIG 1000ML POUR BTL (IV SOLUTION) ×3 IMPLANT
PACK TOTAL KNEE CUSTOM (KITS) ×3 IMPLANT
PADDING CAST COTTON 6X4 STRL (CAST SUPPLIES) ×7 IMPLANT
POSITIONER SURGICAL ARM (MISCELLANEOUS) ×3 IMPLANT
SET HNDPC FAN SPRY TIP SCT (DISPOSABLE) ×1 IMPLANT
STRIP CLOSURE SKIN 1/2X4 (GAUZE/BANDAGES/DRESSINGS) ×4 IMPLANT
SUT MNCRL AB 4-0 PS2 18 (SUTURE) ×3 IMPLANT
SUT STRATAFIX 0 PDS 27 VIOLET (SUTURE) ×3
SUT VIC AB 2-0 CT1 27 (SUTURE) ×9
SUT VIC AB 2-0 CT1 TAPERPNT 27 (SUTURE) ×3 IMPLANT
SUTURE STRATFX 0 PDS 27 VIOLET (SUTURE) ×1 IMPLANT
SYR 30ML LL (SYRINGE) ×4 IMPLANT
TRAY FOLEY CATH 14FRSI W/METER (CATHETERS) ×2 IMPLANT
TRAY FOLEY W/METER SILVER 16FR (SET/KITS/TRAYS/PACK) ×1 IMPLANT
WATER STERILE IRR 1000ML POUR (IV SOLUTION) ×6 IMPLANT
WRAP KNEE MAXI GEL POST OP (GAUZE/BANDAGES/DRESSINGS) ×3 IMPLANT
YANKAUER SUCT BULB TIP 10FT TU (MISCELLANEOUS) ×3 IMPLANT
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
## 2017-04-18 NOTE — Transfer of Care (Signed)
Immediate Anesthesia Transfer of Care Note  Patient: [name]  Procedure(s) Performed: Procedure(s) with comments: LEFT TOTAL KNEE ARTHROPLASTY (Left) - canal block  Patient Location: PACU  Anesthesia Type:Regional and Spinal  Level of Consciousness: awake, alert  and oriented  Airway & Oxygen Therapy: Patient Spontanous Breathing and Patient connected to face mask oxygen  Post-op Assessment: Report given to RN and Post -op Vital signs reviewed and stable  Post vital signs: Reviewed and stable  Last Vitals:  Vitals:   04/18/17 1405 04/18/17 1415  BP: (!) 112/53 (!) 122/58  Pulse: 76 80  Resp: 16 20  Temp:    SpO2: 98% 98%    Last Pain:  Vitals:   04/18/17 1415  TempSrc:   PainSc: 0-No pain      Patients Stated Pain Goal: 4 (19/62/22 [id number])  Complications: No apparent anesthesia complications

## 2017-04-18 NOTE — Progress Notes (Signed)
Assisted Dr. Oddono with left, ultrasound guided, adductor canal block. Side rails up, monitors on throughout procedure. See vital signs in flow sheet. Tolerated Procedure well.  

## 2017-04-18 NOTE — Op Note (Signed)
OPERATIVE REPORT-TOTAL KNEE ARTHROPLASTY   Pre-operative diagnosis- Osteoarthritis  Left knee(s)  Post-operative diagnosis- Osteoarthritis Left knee(s)  Procedure-  Left  Total Knee Arthroplasty  Surgeon- [name]. Oliana Gowens, MD  Assistant- Arlee Muslim, PA-C   Anesthesia-  Adductor canal block and spinal  EBL-* No blood loss amount entered *   Drains Hemovac  Tourniquet time-  Total Tourniquet Time Documented: Thigh (Left) - 54 minutes Total: Thigh (Left) - 54 minutes     Complications- None  Condition-PACU - hemodynamically stable.   Brief Clinical Note  [name] is a 66 y.o. year old female with end stage OA of her left knee with progressively worsening pain and dysfunction. She has constant pain, with activity and at rest and significant functional deficits with difficulties even with ADLs. She has had extensive non-op management including analgesics, injections of cortisone and viscosupplements, and home exercise program, but remains in significant pain with significant dysfunction. She had a previous distal femur fracture and the hardware was removed a few months ago in preparation for this operation.Radiographs show bone on bone arthritis all 3 compartments. She presents now for left Total Knee Arthroplasty.     Procedure in detail---   The patient is brought into the operating room and positioned supine on the operating table. After successful administration of  Adductor canal block and spinal,   a tourniquet is placed high on the  Left thigh(s) and the lower extremity is prepped and draped in the usual sterile fashion. Time out is performed by the operating team and then the  Left lower extremity is wrapped in Esmarch, knee flexed and the tourniquet inflated to 300 mmHg.       A midline incision is made with a ten blade through the subcutaneous tissue to the level of the extensor mechanism. A fresh blade is used to make a medial parapatellar arthrotomy. Soft tissue  over the proximal medial tibia is subperiosteally elevated to the joint line with a knife and into the semimembranosus bursa with a Cobb elevator. Soft tissue over the proximal lateral tibia is elevated with attention being paid to avoiding the patellar tendon on the tibial tubercle. The patella is everted, knee flexed 90 degrees and the ACL and PCL are removed. Findings are bone on bone all 3 compartments.        The drill is used to create a starting hole in the distal femur and the canal is thoroughly irrigated with sterile saline to remove the fatty contents. There was a stenotic area in the canal from the previous fracture and I reamed with the flexible reamers past this area to open up the canal to allow the alignment rod to pass through.The 5 degree Left  valgus alignment guide is placed into the femoral canal and the distal femoral cutting block is pinned to remove 10 mm off the distal femur. Resection is made with an oscillating saw.      The tibia is subluxed forward and the menisci are removed. The extramedullary alignment guide is placed referencing proximally at the medial aspect of the tibial tubercle and distally along the second metatarsal axis and tibial crest. The block is pinned to remove 40mm off the more deficient medial  side. Resection is made with an oscillating saw. Size 3is the most appropriate size for the tibia and the proximal tibia is prepared with the modular drill and keel punch for that size.      The femoral sizing guide is placed and size 3  is most appropriate. Rotation is marked off the epicondylar axis and confirmed by creating a rectangular flexion gap at 90 degrees. The size 3 cutting block is pinned in this rotation and the anterior, posterior and chamfer cuts are made with the oscillating saw. The intercondylar block is then placed and that cut is made.      Trial size 3 tibial component, trial size 3 posterior stabilized femur and a 12.5  mm posterior stabilized rotating  platform insert trial is placed. Full extension is achieved with excellent varus/valgus and anterior/posterior balance throughout full range of motion. The patella is everted and thickness measured to be 22  mm. Free hand resection is taken to 12 mm, a 35 template is placed, lug holes are drilled, trial patella is placed, and it tracks normally. Osteophytes are removed off the posterior femur with the trial in place. All trials are removed and the cut bone surfaces prepared with pulsatile lavage. Cement is mixed and once ready for implantation, the size 3 tibial implant, size  3 posterior stabilized femoral component, and the size 35 patella are cemented in place and the patella is held with the clamp. The trial insert is placed and the knee held in full extension. The Exparel (20 ml mixed with 60 ml saline)  injected into the extensor mechanism, posterior capsule, medial and lateral gutters and subcutaneous tissues.  All extruded cement is removed and once the cement is hard the permanent 12.5 mm posterior stabilized rotating platform insert is placed into the tibial tray.      The wound is copiously irrigated with saline solution and the extensor mechanism closed over a hemovac drain with #1 V-loc suture. The tourniquet is released for a total tourniquet time of 54  minutes. Flexion against gravity is 125 degrees and the patella tracks normally. Subcutaneous tissue is closed with 2.0 vicryl and subcuticular with running 4.0 Monocryl. The incision is cleaned and dried and steri-strips and a bulky sterile dressing are applied. The limb is placed into a knee immobilizer and the patient is awakened and transported to recovery in stable condition.      Please note that a surgical assistant was a medical necessity for this procedure in order to perform it in a safe and expeditious manner. Surgical assistant was necessary to retract the ligaments and vital neurovascular structures to prevent injury to them and also  necessary for proper positioning of the limb to allow for anatomic placement of the prosthesis.   [name] Mckenzie Linnen, MD    04/18/2017, 4:40 PM

## 2017-04-18 NOTE — Anesthesia Procedure Notes (Signed)
Spinal  Patient location during procedure: OR End time: 04/18/2017 3:13 PM Staffing Resident/CRNA: Noralyn Pick D Performed: anesthesiologist  Preanesthetic Checklist Completed: patient identified, site marked, surgical consent, pre-op evaluation, timeout performed, IV checked, risks and benefits discussed and monitors and equipment checked Spinal Block Patient position: sitting Prep: Betadine Patient monitoring: heart rate, continuous pulse ox and blood pressure Approach: midline Location: L3-4 Injection technique: single-shot Needle Needle type: Sprotte  Needle gauge: 24 G Needle length: 9 cm Assessment Sensory level: T6 Additional Notes Expiration date of kit checked and confirmed. Patient tolerated procedure well, without complications.

## 2017-04-18 NOTE — Anesthesia Procedure Notes (Signed)
Anesthesia Regional Block: Adductor canal block   Pre-Anesthetic Checklist: ,, timeout performed, Correct Patient, Correct Site, Correct Laterality, Correct Procedure, Correct Position, site marked, Risks and benefits discussed,  Surgical consent,  Pre-op evaluation,  At surgeon's request and post-op pain management  Laterality: Left  Prep: chloraprep       Needles:  Injection technique: Single-shot  Needle Type: Echogenic Stimulator Needle     Needle Length: 5cm  Needle Gauge: 22     Additional Needles:   Procedures: ultrasound guided, nerve stimulator,,,,,,  Narrative:  Start time: 04/18/2017 1:50 PM End time: 04/18/2017 2:00 PM Injection made incrementally with aspirations every 5 mL.  Performed by: Personally  Anesthesiologist: Mazella Deen  Additional Notes: Functioning IV was confirmed and monitors were applied.  A 90mm 22ga Arrow echogenic stimulator needle was used. Sterile prep and drape,hand hygiene and sterile gloves were used. Ultrasound guidance: relevant anatomy identified, needle position confirmed, local anesthetic spread visualized around nerve(s)., vascular puncture avoided.  Image printed for medical record. Negative aspiration and negative test dose prior to incremental administration of local anesthetic. The patient tolerated the procedure well.

## 2017-04-18 NOTE — Interval H&P Note (Signed)
History and Physical Interval Note:  04/18/2017 12:40 PM  [name]  has presented today for surgery, with the diagnosis of Post-traumatic osteoarthritis Left Knee  The various methods of treatment have been discussed with the patient and family. After consideration of risks, benefits and other options for treatment, the patient has consented to  Procedure(s): LEFT TOTAL KNEE ARTHROPLASTY (Left) as a surgical intervention .  The patient's history has been reviewed, patient examined, no change in status, stable for surgery.  I have reviewed the patient's chart and labs.  Questions were answered to the patient's satisfaction.     Gearlean Alf

## 2017-04-18 NOTE — Discharge Instructions (Addendum)
° °Dr. Frank Aluisio °Total Joint Specialist °[facility] °[address] °[place], [place] [address] °[phone] ° °TOTAL KNEE REPLACEMENT POSTOPERATIVE DIRECTIONS ° °Knee Rehabilitation, Guidelines Following Surgery  °Results after knee surgery are often greatly improved when you follow the exercise, range of motion and muscle strengthening exercises prescribed by your doctor. Safety measures are also important to protect the knee from further injury. Any time any of these exercises cause you to have increased pain or swelling in your knee joint, decrease the amount until you are comfortable again and slowly increase them. If you have problems or questions, call your caregiver or physical therapist for advice.  ° °HOME CARE INSTRUCTIONS  °Remove items at home which could result in a fall. This includes throw rugs or furniture in walking pathways.  °· ICE to the affected knee every three hours for 30 minutes at a time and then as needed for pain and swelling.  Continue to use ice on the knee for pain and swelling from surgery. You may notice swelling that will progress down to the foot and ankle.  This is normal after surgery.  Elevate the leg when you are not up walking on it.   °· Continue to use the breathing machine which will help keep your temperature down.  It is common for your temperature to cycle up and down following surgery, especially at night when you are not up moving around and exerting yourself.  The breathing machine keeps your lungs expanded and your temperature down. °· Do not place pillow under knee, focus on keeping the knee straight while resting ° °DIET °You may resume your previous home diet once your are discharged from the hospital. ° °DRESSING / WOUND CARE / SHOWERING °You may shower 3 days after surgery, but keep the wounds dry during showering.  You may use an occlusive plastic wrap (Press'n Seal for example), NO SOAKING/SUBMERGING IN THE BATHTUB.  If the  bandage gets wet, change with a clean dry gauze.  If the incision gets wet, pat the wound dry with a clean towel. °You may start showering once you are discharged home but do not submerge the incision under water. Just pat the incision dry and apply a dry gauze dressing on daily. °Change the surgical dressing daily and reapply a dry dressing each time. ° °ACTIVITY °Walk with your walker as instructed. °Use walker as long as suggested by your caregivers. °Avoid periods of inactivity such as sitting longer than an hour when not asleep. This helps prevent blood clots.  °You may resume a sexual relationship in one month or when given the OK by your doctor.  °You may return to work once you are cleared by your doctor.  °Do not drive a car for 6 weeks or until released by you surgeon.  °Do not drive while taking narcotics. ° °WEIGHT BEARING °Weight bearing as tolerated with assist device (walker, cane, etc) as directed, use it as long as suggested by your surgeon or therapist, typically at least 4-6 weeks. ° °POSTOPERATIVE CONSTIPATION PROTOCOL °Constipation - defined medically as fewer than three stools per week and severe constipation as less than one stool per week. ° °One of the most common issues patients have following surgery is constipation.  Even if you have a regular bowel pattern at home, your normal regimen is likely to be disrupted due to multiple reasons following surgery.  Combination of anesthesia, postoperative narcotics, change in appetite and fluid intake all can affect your bowels.    In order to avoid complications following surgery, here are some recommendations in order to help you during your recovery period. ° °Colace (docusate) - Pick up an over-the-counter form of Colace or another stool softener and take twice a day as long as you are requiring postoperative pain medications.  Take with a full glass of water daily.  If you experience loose stools or diarrhea, hold the colace until you stool forms  back up.  If your symptoms do not get better within 1 week or if they get worse, check with your doctor. ° °Dulcolax (bisacodyl) - Pick up over-the-counter and take as directed by the product packaging as needed to assist with the movement of your bowels.  Take with a full glass of water.  Use this product as needed if not relieved by Colace only.  ° °MiraLax (polyethylene glycol) - Pick up over-the-counter to have on hand.  MiraLax is a solution that will increase the amount of water in your bowels to assist with bowel movements.  Take as directed and can mix with a glass of water, juice, soda, coffee, or tea.  Take if you go more than two days without a movement. °Do not use MiraLax more than once per day. Call your doctor if you are still constipated or irregular after using this medication for 7 days in a row. ° °If you continue to have problems with postoperative constipation, please contact the office for further assistance and recommendations.  If you experience "the worst abdominal pain ever" or develop nausea or vomiting, please contact the office immediatly for further recommendations for treatment. ° °ITCHING ° If you experience itching with your medications, try taking only a single pain pill, or even half a pain pill at a time.  You can also use Benadryl over the counter for itching or also to help with sleep.  ° °TED HOSE STOCKINGS °Wear the elastic stockings on both legs for three weeks following surgery during the day but you may remove then at night for sleeping. ° °MEDICATIONS °See your medication summary on the “After Visit Summary” that the nursing staff will review with you prior to discharge.  You may have some home medications which will be placed on hold until you complete the course of blood thinner medication.  It is important for you to complete the blood thinner medication as prescribed by your surgeon.  Continue your approved medications as instructed at time of  discharge. ° °PRECAUTIONS °If you experience chest pain or shortness of breath - call 911 immediately for transfer to the hospital emergency department.  °If you develop a fever greater that 101 F, purulent drainage from wound, increased redness or drainage from wound, foul odor from the wound/dressing, or calf pain - CONTACT YOUR SURGEON.   °                                                °FOLLOW-UP APPOINTMENTS °Make sure you keep all of your appointments after your operation with your surgeon and caregivers. You should call the office at the above phone number and make an appointment for approximately two weeks after the date of your surgery or on the date instructed by your surgeon outlined in the "After Visit Summary". ° ° °RANGE OF MOTION AND STRENGTHENING EXERCISES  °Rehabilitation of the knee is important following a knee injury or   an operation. After just a few days of immobilization, the muscles of the thigh which control the knee become weakened and shrink (atrophy). Knee exercises are designed to build up the tone and strength of the thigh muscles and to improve knee motion. Often times heat used for twenty to thirty minutes before working out will loosen up your tissues and help with improving the range of motion but do not use heat for the first two weeks following surgery. These exercises can be done on a training (exercise) mat, on the floor, on a table or on a bed. Use what ever works the best and is most comfortable for you Knee exercises include:  °Leg Lifts - While your knee is still immobilized in a splint or cast, you can do straight leg raises. Lift the leg to 60 degrees, hold for 3 sec, and slowly lower the leg. Repeat 10-20 times 2-3 times daily. Perform this exercise against resistance later as your knee gets better.  °Quad and Hamstring Sets - Tighten up the muscle on the front of the thigh (Quad) and hold for 5-10 sec. Repeat this 10-20 times hourly. Hamstring sets are done by pushing the  foot backward against an object and holding for 5-10 sec. Repeat as with quad sets.  °· Leg Slides: Lying on your back, slowly slide your foot toward your buttocks, bending your knee up off the floor (only go as far as is comfortable). Then slowly slide your foot back down until your leg is flat on the floor again. °· Angel Wings: Lying on your back spread your legs to the side as far apart as you can without causing discomfort.  °A rehabilitation program following serious knee injuries can speed recovery and prevent re-injury in the future due to weakened muscles. Contact your doctor or a physical therapist for more information on knee rehabilitation.  ° °IF YOU ARE TRANSFERRED TO A SKILLED REHAB FACILITY °If the patient is transferred to a skilled rehab facility following release from the hospital, a list of the current medications will be sent to the facility for the patient to continue.  When discharged from the skilled rehab facility, please have the facility set up the patient's Home Health Physical Therapy prior to being released. Also, the skilled facility will be responsible for providing the patient with their medications at time of release from the facility to include their pain medication, the muscle relaxants, and their blood thinner medication. If the patient is still at the rehab facility at time of the two week follow up appointment, the skilled rehab facility will also need to assist the patient in arranging follow up appointment in our office and any transportation needs. ° °MAKE SURE YOU:  °Understand these instructions.  °Get help right away if you are not doing well or get worse.  ° ° °Pick up stool softner and laxative for home use following surgery while on pain medications. °Do not submerge incision under water. °Please use good hand washing techniques while changing dressing each day. °May shower starting three days after surgery. °Please use a clean towel to pat the incision dry following  showers. °Continue to use ice for pain and swelling after surgery. °Do not use any lotions or creams on the incision until instructed by your surgeon. ° °Take Xarelto for two and a half more weeks following discharge from the hospital, then discontinue Xarelto. °Once the patient has completed the Xarelto, they may resume the 81 mg Aspirin. ° ° ° °Information on   my medicine - XARELTO (Rivaroxaban)  Why was Xarelto prescribed for you? Xarelto was prescribed for you to reduce the risk of blood clots forming after orthopedic surgery. The medical term for these abnormal blood clots is venous thromboembolism (VTE).  What do you need to know about xarelto ? Take your Xarelto ONCE DAILY at the same time every day. You may take it either with or without food.  If you have difficulty swallowing the tablet whole, you may crush it and mix in applesauce just prior to taking your dose.  Take Xarelto exactly as prescribed by your doctor and DO NOT stop taking Xarelto without talking to the doctor who prescribed the medication.  Stopping without other VTE prevention medication to take the place of Xarelto may increase your risk of developing a clot.  After discharge, you should have regular check-up appointments with your healthcare provider that is prescribing your Xarelto.    What do you do if you miss a dose? If you miss a dose, take it as soon as you remember on the same day then continue your regularly scheduled once daily regimen the next day. Do not take two doses of Xarelto on the same day.   Important Safety Information A possible side effect of Xarelto is bleeding. You should call your healthcare provider right away if you experience any of the following: ? Bleeding from an injury or your nose that does not stop. ? Unusual colored urine (red or dark brown) or unusual colored stools (red or black). ? Unusual bruising for unknown reasons. ? A serious fall or if you hit your head (even if  there is no bleeding).  Some medicines may interact with Xarelto and might increase your risk of bleeding while on Xarelto. To help avoid this, consult your healthcare provider or pharmacist prior to using any new prescription or non-prescription medications, including herbals, aspirin, vitamins, non-steroidal anti-inflammatory drugs (NSAIDs) and supplements.  This website has more information on Xarelto: [email or web address]   Koleen Nimrod, PharmD Candidate

## 2017-04-18 NOTE — Anesthesia Postprocedure Evaluation (Signed)
Anesthesia Post Note  Patient: [name]  Procedure(s) Performed: Procedure(s) (LRB): LEFT TOTAL KNEE ARTHROPLASTY (Left)     Patient location during evaluation: PACU Anesthesia Type: General Level of consciousness: awake and alert Pain management: pain level controlled Vital Signs Assessment: post-procedure vital signs reviewed and stable Respiratory status: spontaneous breathing, nonlabored ventilation, respiratory function stable and patient connected to nasal cannula oxygen Cardiovascular status: blood pressure returned to baseline and stable Postop Assessment: no signs of nausea or vomiting Anesthetic complications: no    Last Vitals:  Vitals:   04/18/17 1715 04/18/17 1730  BP: 135/61 (!) 147/77  Pulse: 60 (!) 55  Resp: 13 17  Temp:    SpO2: 100% 100%    Last Pain:  Vitals:   04/18/17 1415  TempSrc:   PainSc: 0-No pain                 Cartrell Bentsen

## 2017-04-19 ENCOUNTER — Encounter (HOSPITAL_COMMUNITY): Payer: Self-pay | Admitting: Orthopedic Surgery

## 2017-04-19 LAB — BASIC METABOLIC PANEL
Anion gap: 8 (ref 5–15)
BUN: 10 mg/dL (ref 6–20)
CO2: 23 mmol/L (ref 22–32)
Calcium: 8.7 mg/dL — ABNORMAL LOW (ref 8.9–10.3)
Chloride: 105 mmol/L (ref 101–111)
Creatinine, Ser: 0.71 mg/dL (ref 0.44–1.00)
GFR calc Af Amer: >60 mL/min (ref >60)
GFR calc non Af Amer: >60 mL/min (ref >60)
Glucose, Bld: 121 mg/dL — ABNORMAL HIGH (ref 65–99)
Potassium: 3.9 mmol/L (ref 3.5–5.1)
Sodium: 136 mmol/L (ref 135–145)

## 2017-04-19 LAB — CBC
HCT: 31.7 % — ABNORMAL LOW (ref 36.0–46.0)
Hemoglobin: 10.8 g/dL — ABNORMAL LOW (ref 12.0–15.0)
MCH: 32 pg (ref 26.0–34.0)
MCHC: 34.1 g/dL (ref 30.0–36.0)
MCV: 94.1 fL (ref 78.0–100.0)
Platelets: 267 10*3/uL (ref 150–400)
RBC: 3.37 MIL/uL — ABNORMAL LOW (ref 3.87–5.11)
RDW: 13.1 % (ref 11.5–15.5)
WBC: 18.3 10*3/uL — ABNORMAL HIGH (ref 4.0–10.5)

## 2017-04-19 MED ORDER — METHOCARBAMOL 500 MG PO TABS: 500.0000 mg | ORAL_TABLET | Freq: Four times a day (QID) | ORAL | 0 refills | Status: DC | PRN

## 2017-04-19 MED ORDER — TRAMADOL HCL 50 MG PO TABS: 50.0000 mg | ORAL_TABLET | Freq: Four times a day (QID) | ORAL | 0 refills | Status: DC | PRN

## 2017-04-19 MED ORDER — LEVOTHYROXINE SODIUM 100 MCG PO TABS
100.0000 ug | ORAL_TABLET | Freq: Every day | ORAL | Status: DC
  Administered 2017-04-19 – 2017-04-20 (×2): 100 ug via ORAL
  Filled 2017-04-19: qty 1

## 2017-04-19 MED ORDER — OXYCODONE HCL 5 MG PO TABS: 5.0000 mg | ORAL_TABLET | ORAL | 0 refills | Status: DC | PRN

## 2017-04-19 MED ORDER — RIVAROXABAN 10 MG PO TABS: 10.0000 mg | ORAL_TABLET | Freq: Every day | ORAL | 0 refills | Status: DC

## 2017-04-19 NOTE — Progress Notes (Signed)
Physical Therapy Treatment Patient Details Name: [name] MRN: [id number] DOB: 1951-07-10 Today's Date: 04/19/2017    History of Present Illness s/p L TKA, h/o hardware removal    PT Comments    The patient rated the pain as 10, tolerated ambulating x 200'. Plans DC tomorrow.   Follow Up Recommendations  DC plan and follow up therapy as arranged by surgeon     Equipment Recommendations  Rolling walker with 5" wheels    Recommendations for Other Services       Precautions / Restrictions Precautions Precautions: Knee Precaution Comments: didi not use KI Required Braces or Orthoses: Knee Immobilizer - Right    Mobility  Bed Mobility Overal bed mobility: Modified Independent                Transfers Overall transfer level: Needs assistance Equipment used: Rolling walker (2 wheeled) Transfers: Sit to/from Stand Sit to Stand: Supervision            Ambulation/Gait Ambulation/Gait assistance: Min guard Ambulation Distance (Feet): 200 Feet Assistive device: Rolling walker (2 wheeled) Gait Pattern/deviations: Step-to pattern;Step-through pattern     General Gait Details: cues for safety. used used shoes with a lift on right   Stairs            Wheelchair Mobility    Modified Rankin (Stroke Patients Only)       Balance                                            Cognition Arousal/Alertness: Awake/alert                                            Exercises      General Comments        Pertinent Vitals/Pain Pain Score: 10-Worst pain ever Pain Location: L knee Pain Descriptors / Indicators: Discomfort;Grimacing Pain Intervention(s): Premedicated before session;Repositioned;Ice applied;Monitored during session    Home Living                      Prior Function            PT Goals (current goals can now be found in the care plan section) Progress towards PT goals: Progressing  toward goals    Frequency    7X/week      PT Plan Current plan remains appropriate    Co-evaluation              AM-PAC PT "6 Clicks" Daily Activity  Outcome Measure  Difficulty turning over in bed (including adjusting bedclothes, sheets and blankets)?: None Difficulty moving from lying on back to sitting on the side of the bed? : None Difficulty sitting down on and standing up from a chair with arms (e.g., wheelchair, bedside commode, etc,.)?: Unable Help needed moving to and from a bed to chair (including a wheelchair)?: A Little Help needed walking in hospital room?: A Little Help needed climbing 3-5 steps with a railing? : A Lot 6 Click Score: 17    End of Session   Activity Tolerance: Patient tolerated treatment well Patient left: with call bell/phone within reach;with family/visitor present;with bed alarm set Nurse Communication: Mobility status PT Visit Diagnosis: Pain;Difficulty in walking, not elsewhere classified (R26.2) Pain -  Right/Left: Left Pain - part of body: Knee     Time: 1459-1530 PT Time Calculation (min) (ACUTE ONLY): 31 min  Charges:  $Gait Training: 23-37 mins                    G CodesTresa Endo PT [phone]    Claretha Cooper 04/19/2017, 5:29 PM

## 2017-04-19 NOTE — Discharge Summary (Signed)
Physician Discharge Summary   Patient ID: [name] MRN: [id number] DOB/AGE: 1951-06-11 66 y.o.  Admit date: 04/18/2017 Discharge date: 04/20/2017  Primary Diagnosis:  Osteoarthritis  Left knee(s)  Admission Diagnoses:  Past Medical History:  Diagnosis Date  . Anxiety   . Arthritis   . Claustrophobia   . Hypothyroidism    Discharge Diagnoses:   Principal Problem:   OA (osteoarthritis) of knee  Estimated body mass index is 33.13 kg/m as calculated from the following:   Height as of this encounter: '5\' 4"'  (1.626 m).   Weight as of this encounter: 87.5 kg (193 lb).  Procedure:  Procedure(s) (LRB): LEFT TOTAL KNEE ARTHROPLASTY (Left)   Consults: None  HPI: [name] is a 66 y.o. year old female with end stage OA of her left knee with progressively worsening pain and dysfunction. She has constant pain, with activity and at rest and significant functional deficits with difficulties even with ADLs. She has had extensive non-op management including analgesics, injections of cortisone and viscosupplements, and home exercise program, but remains in significant pain with significant dysfunction. She had a previous distal femur fracture and the hardware was removed a few months ago in preparation for this operation.Radiographs show bone on bone arthritis all 3 compartments. She presents now for left Total Knee Arthroplasty.   Laboratory Data: Admission on 04/18/2017, Discharged on 04/20/2017  Component Date Value Ref Range Status  . WBC 04/19/2017 18.3* 4.0 - 10.5 K/uL Final  . RBC 04/19/2017 3.37* 3.87 - 5.11 MIL/uL Final  . Hemoglobin 04/19/2017 10.8* 12.0 - 15.0 g/dL Final  . HCT 04/19/2017 31.7* 36.0 - 46.0 % Final  . MCV 04/19/2017 94.1  78.0 - 100.0 fL Final  . MCH 04/19/2017 32.0  26.0 - 34.0 pg Final  . MCHC 04/19/2017 34.1  30.0 - 36.0 g/dL Final  . RDW 04/19/2017 13.1  11.5 - 15.5 % Final  . Platelets 04/19/2017 267  150 - 400 K/uL Final  . Sodium 04/19/2017 136   135 - 145 mmol/L Final  . Potassium 04/19/2017 3.9  3.5 - 5.1 mmol/L Final  . Chloride 04/19/2017 105  101 - 111 mmol/L Final  . CO2 04/19/2017 23  22 - 32 mmol/L Final  . Glucose, Bld 04/19/2017 121* 65 - 99 mg/dL Final  . BUN 04/19/2017 10  6 - 20 mg/dL Final  . Creatinine, Ser 04/19/2017 0.71  0.44 - 1.00 mg/dL Final  . Calcium 04/19/2017 8.7* 8.9 - 10.3 mg/dL Final  . GFR calc non Af Amer 04/19/2017 >60  >60 mL/min Final  . GFR calc Af Amer 04/19/2017 >60  >60 mL/min Final   Comment: (NOTE) The eGFR has been calculated using the CKD EPI equation. This calculation has not been validated in all clinical situations. eGFR's persistently <60 mL/min signify possible Chronic Kidney Disease.   . Anion gap 04/19/2017 8  5 - 15 Final  . WBC 04/20/2017 16.4* 4.0 - 10.5 K/uL Final  . RBC 04/20/2017 2.79* 3.87 - 5.11 MIL/uL Final  . Hemoglobin 04/20/2017 9.2* 12.0 - 15.0 g/dL Final  . HCT 04/20/2017 26.5* 36.0 - 46.0 % Final  . MCV 04/20/2017 95.0  78.0 - 100.0 fL Final  . MCH 04/20/2017 33.0  26.0 - 34.0 pg Final  . MCHC 04/20/2017 34.7  30.0 - 36.0 g/dL Final  . RDW 04/20/2017 13.5  11.5 - 15.5 % Final  . Platelets 04/20/2017 241  150 - 400 K/uL Final  . Sodium 04/20/2017 139  135 -  145 mmol/L Final  . Potassium 04/20/2017 4.1  3.5 - 5.1 mmol/L Final  . Chloride 04/20/2017 107  101 - 111 mmol/L Final  . CO2 04/20/2017 26  22 - 32 mmol/L Final  . Glucose, Bld 04/20/2017 118* 65 - 99 mg/dL Final  . BUN 04/20/2017 12  6 - 20 mg/dL Final  . Creatinine, Ser 04/20/2017 0.85  0.44 - 1.00 mg/dL Final  . Calcium 04/20/2017 8.5* 8.9 - 10.3 mg/dL Final  . GFR calc non Af Amer 04/20/2017 >60  >60 mL/min Final  . GFR calc Af Amer 04/20/2017 >60  >60 mL/min Final   Comment: (NOTE) The eGFR has been calculated using the CKD EPI equation. This calculation has not been validated in all clinical situations. eGFR's persistently <60 mL/min signify possible Chronic Kidney Disease.   [name] gap  04/20/2017 6  5 - 15 Final  Hospital Outpatient Visit on 04/08/2017  Component Date Value Ref Range Status  . aPTT 04/08/2017 33  24 - 36 seconds Final  . WBC 04/08/2017 7.9  4.0 - 10.5 K/uL Final  . RBC 04/08/2017 3.77* 3.87 - 5.11 MIL/uL Final  . Hemoglobin 04/08/2017 11.9* 12.0 - 15.0 g/dL Final  . HCT 04/08/2017 35.1* 36.0 - 46.0 % Final  . MCV 04/08/2017 93.1  78.0 - 100.0 fL Final  . MCH 04/08/2017 31.6  26.0 - 34.0 pg Final  . MCHC 04/08/2017 33.9  30.0 - 36.0 g/dL Final  . RDW 04/08/2017 12.7  11.5 - 15.5 % Final  . Platelets 04/08/2017 299  150 - 400 K/uL Final  . Sodium 04/08/2017 141  135 - 145 mmol/L Final  . Potassium 04/08/2017 4.0  3.5 - 5.1 mmol/L Final  . Chloride 04/08/2017 105  101 - 111 mmol/L Final  . CO2 04/08/2017 27  22 - 32 mmol/L Final  . Glucose, Bld 04/08/2017 96  65 - 99 mg/dL Final  . BUN 04/08/2017 12  6 - 20 mg/dL Final  . Creatinine, Ser 04/08/2017 0.87  0.44 - 1.00 mg/dL Final  . Calcium 04/08/2017 9.1  8.9 - 10.3 mg/dL Final  . Total Protein 04/08/2017 7.0  6.5 - 8.1 g/dL Final  . Albumin 04/08/2017 4.3  3.5 - 5.0 g/dL Final  . AST 04/08/2017 24  15 - 41 U/L Final  . ALT 04/08/2017 18  14 - 54 U/L Final  . Alkaline Phosphatase 04/08/2017 78  38 - 126 U/L Final  . Total Bilirubin 04/08/2017 0.6  0.3 - 1.2 mg/dL Final  . GFR calc non Af Amer 04/08/2017 >60  >60 mL/min Final  . GFR calc Af Amer 04/08/2017 >60  >60 mL/min Final   Comment: (NOTE) The eGFR has been calculated using the CKD EPI equation. This calculation has not been validated in all clinical situations. eGFR's persistently <60 mL/min signify possible Chronic Kidney Disease.   . Anion gap 04/08/2017 9  5 - 15 Final  . Prothrombin Time 04/08/2017 13.2  11.4 - 15.2 seconds Final  . INR 04/08/2017 1.00   Final  . ABO/RH(D) 04/08/2017 A POS   Final  . Antibody Screen 04/08/2017 NEG   Final  . Sample Expiration 04/08/2017 04/21/2017   Final  . Extend sample reason 04/08/2017 NO  TRANSFUSIONS OR PREGNANCY IN THE PAST 3 MONTHS   Final  . MRSA, PCR 04/08/2017 NEGATIVE  NEGATIVE Final  . Staphylococcus aureus 04/08/2017 NEGATIVE  NEGATIVE Final   Comment:        The Xpert SA Assay (FDA approved  for NASAL specimens in patients over 6 years of age), is one component of a comprehensive surveillance program.  Test performance has been validated by [facility] for patients greater than or equal to 67 year old. It is not intended to diagnose infection nor to guide or monitor treatment.   . ABO/RH(D) 04/08/2017 A POS   Final     X-Rays:No results found.  EKG: Orders placed or performed during the hospital encounter of 04/08/17  . EKG 12 lead  . EKG 12 lead     Hospital Course: [name] is a 66 y.o. who was admitted to [facility]. They were brought to the operating room on 04/18/2017 and underwent Procedure(s): LEFT TOTAL KNEE ARTHROPLASTY.  Patient tolerated the procedure well and was later transferred to the recovery room and then to the orthopaedic floor for postoperative care.  They were given PO and IV analgesics for pain control following their surgery.  They were given 24 hours of postoperative antibiotics of  Anti-infectives    Start     Dose/Rate Route Frequency Ordered Stop   04/19/17 0600  ceFAZolin (ANCEF) IVPB 2g/100 mL premix     2 g 200 mL/hr over 30 Minutes Intravenous On call to O.R. 04/18/17 1224 04/18/17 1515   04/18/17 2100  ceFAZolin (ANCEF) IVPB 2g/100 mL premix     2 g 200 mL/hr over 30 Minutes Intravenous Every 6 hours 04/18/17 1827 04/19/17 0331   04/18/17 1229  ceFAZolin (ANCEF) 2-4 GM/100ML-% IVPB    Comments:  [place] Session   : cabinet override      04/18/17 1229 04/18/17 1515     and started on DVT prophylaxis in the form of Xarelto.   PT and OT were ordered for total joint protocol.  Discharge planning consulted to help with postop disposition and equipment needs.  Patient had a decent night on the evening of  surgery.  They started to get up OOB with therapy on day one. Hemovac drain was pulled without difficulty.  Continued to work with therapy into day two.  Dressing was changed on day two and the incision was healing well. Patient was seen in rounds on day two by Dr. Wynelle Link and was ready to go home.   Diet - Regular diet Follow up - in 2 weeks Activity - WBAT Disposition - Home Condition Upon Discharge - Good D/C Meds - See DC Summary DVT Prophylaxis - Xarelto   Discharge Instructions    Call MD / Call 911    Complete by:  As directed    If you experience chest pain or shortness of breath, CALL 911 and be transported to the hospital emergency room.  If you develope a fever above 101 F, pus (white drainage) or increased drainage or redness at the wound, or calf pain, call your surgeon's office.   Change dressing    Complete by:  As directed    Change dressing daily with sterile 4 x 4 inch gauze dressing and apply TED hose. Do not submerge the incision under water.   Constipation Prevention    Complete by:  As directed    Drink plenty of fluids.  Prune juice may be helpful.  You may use a stool softener, such as Colace (over the counter) 100 mg twice a day.  Use MiraLax (over the counter) for constipation as needed.   Diet - low sodium heart healthy    Complete by:  As directed    Discharge instructions  Complete by:  As directed    Take Xarelto for two and a half more weeks, then discontinue Xarelto. Once the patient has completed the Xarelto, they may resume the 81 mg Aspirin.   Pick up stool softner and laxative for home use following surgery while on pain medications. Do not submerge incision under water. Please use good hand washing techniques while changing dressing each day. May shower starting three days after surgery. Please use a clean towel to pat the incision dry following showers. Continue to use ice for pain and swelling after surgery. Do not use any lotions or creams  on the incision until instructed by your surgeon.  Wear both TED hose on both legs during the day every day for three weeks, but may remove the TED hose at night at home.  Postoperative Constipation Protocol  Constipation - defined medically as fewer than three stools per week and severe constipation as less than one stool per week.  One of the most common issues patients have following surgery is constipation.  Even if you have a regular bowel pattern at home, your normal regimen is likely to be disrupted due to multiple reasons following surgery.  Combination of anesthesia, postoperative narcotics, change in appetite and fluid intake all can affect your bowels.  In order to avoid complications following surgery, here are some recommendations in order to help you during your recovery period.  Colace (docusate) - Pick up an over-the-counter form of Colace or another stool softener and take twice a day as long as you are requiring postoperative pain medications.  Take with a full glass of water daily.  If you experience loose stools or diarrhea, hold the colace until you stool forms back up.  If your symptoms do not get better within 1 week or if they get worse, check with your doctor.  Dulcolax (bisacodyl) - Pick up over-the-counter and take as directed by the product packaging as needed to assist with the movement of your bowels.  Take with a full glass of water.  Use this product as needed if not relieved by Colace only.   MiraLax (polyethylene glycol) - Pick up over-the-counter to have on hand.  MiraLax is a solution that will increase the amount of water in your bowels to assist with bowel movements.  Take as directed and can mix with a glass of water, juice, soda, coffee, or tea.  Take if you go more than two days without a movement. Do not use MiraLax more than once per day. Call your doctor if you are still constipated or irregular after using this medication for 7 days in a row.  If you  continue to have problems with postoperative constipation, please contact the office for further assistance and recommendations.  If you experience "the worst abdominal pain ever" or develop nausea or vomiting, please contact the office immediatly for further recommendations for treatment.   Do not put a pillow under the knee. Place it under the heel.    Complete by:  As directed    Do not sit on low chairs, stoools or toilet seats, as it may be difficult to get up from low surfaces    Complete by:  As directed    Driving restrictions    Complete by:  As directed    No driving until released by the physician.   Increase activity slowly as tolerated    Complete by:  As directed    Lifting restrictions    Complete by:  As directed    No lifting until released by the physician.   Patient may shower    Complete by:  As directed    You may shower without a dressing once there is no drainage.  Do not wash over the wound.  If drainage remains, do not shower until drainage stops.   TED hose    Complete by:  As directed    Use stockings (TED hose) for 3 weeks on both leg(s).  You may remove them at night for sleeping.   Weight bearing as tolerated    Complete by:  As directed    Laterality:  left   Extremity:  Lower     Allergies as of 04/20/2017   No Known Allergies     Medication List    STOP taking these medications   aspirin EC 81 MG tablet   HYDROcodone-acetaminophen 5-325 MG tablet Commonly known as:  NORCO   ibuprofen 200 MG tablet Commonly known as:  ADVIL,MOTRIN   multivitamin with minerals Tabs tablet     TAKE these medications   irbesartan 300 MG tablet Commonly known as:  AVAPRO Take 300 mg by mouth daily at 3 pm.   methocarbamol 500 MG tablet Commonly known as:  ROBAXIN Take 1 tablet (500 mg total) by mouth every 6 (six) hours as needed for muscle spasms.   metoprolol succinate 50 MG 24 hr tablet Commonly known as:  TOPROL-XL Take 50 mg by mouth every evening.  Take with or immediately following a meal.   oxyCODONE 5 MG immediate release tablet Commonly known as:  Oxy IR/ROXICODONE Take 1-2 tablets (5-10 mg total) by mouth every 4 (four) hours as needed for moderate pain or severe pain.   rivaroxaban 10 MG Tabs tablet Commonly known as:  XARELTO Take 1 tablet (10 mg total) by mouth daily with breakfast.   simvastatin 20 MG tablet Commonly known as:  ZOCOR Take 20 mg by mouth at bedtime.   SYNTHROID 100 MCG tablet Generic drug:  levothyroxine Take 100 mcg by mouth daily before breakfast.   SYSTANE OP Place 1 drop into both eyes daily.   traMADol 50 MG tablet Commonly known as:  ULTRAM Take 1-2 tablets (50-100 mg total) by mouth every 6 (six) hours as needed for moderate pain.            Discharge Care Instructions        Start     Ordered   04/20/17 0000  rivaroxaban (XARELTO) 10 MG TABS tablet  Daily with breakfast    Question:  Supervising Provider  Answer:  Gaynelle Arabian   04/19/17 2149   04/19/17 0000  methocarbamol (ROBAXIN) 500 MG tablet  Every 6 hours PRN    Question:  Supervising Provider  Answer:  Gaynelle Arabian   04/19/17 2149   04/19/17 0000  oxyCODONE (OXY IR/ROXICODONE) 5 MG immediate release tablet  Every 4 hours PRN    Question:  Supervising Provider  Answer:  Gaynelle Arabian   04/19/17 2149   04/19/17 0000  traMADol (ULTRAM) 50 MG tablet  Every 6 hours PRN    Question:  Supervising Provider  Answer:  Gaynelle Arabian   04/19/17 2149   04/19/17 0000  Call MD / Call 911    Comments:  If you experience chest pain or shortness of breath, CALL 911 and be transported to the hospital emergency room.  If you develope a fever above 101 F, pus (white drainage) or increased drainage or redness at the  wound, or calf pain, call your surgeon's office.   04/19/17 2149   04/19/17 0000  Discharge instructions    Comments:  Take Xarelto for two and a half more weeks, then discontinue Xarelto. Once the patient has completed the  Xarelto, they may resume the 81 mg Aspirin.   Pick up stool softner and laxative for home use following surgery while on pain medications. Do not submerge incision under water. Please use good hand washing techniques while changing dressing each day. May shower starting three days after surgery. Please use a clean towel to pat the incision dry following showers. Continue to use ice for pain and swelling after surgery. Do not use any lotions or creams on the incision until instructed by your surgeon.  Wear both TED hose on both legs during the day every day for three weeks, but may remove the TED hose at night at home.  Postoperative Constipation Protocol  Constipation - defined medically as fewer than three stools per week and severe constipation as less than one stool per week.  One of the most common issues patients have following surgery is constipation.  Even if you have a regular bowel pattern at home, your normal regimen is likely to be disrupted due to multiple reasons following surgery.  Combination of anesthesia, postoperative narcotics, change in appetite and fluid intake all can affect your bowels.  In order to avoid complications following surgery, here are some recommendations in order to help you during your recovery period.  Colace (docusate) - Pick up an over-the-counter form of Colace or another stool softener and take twice a day as long as you are requiring postoperative pain medications.  Take with a full glass of water daily.  If you experience loose stools or diarrhea, hold the colace until you stool forms back up.  If your symptoms do not get better within 1 week or if they get worse, check with your doctor.  Dulcolax (bisacodyl) - Pick up over-the-counter and take as directed by the product packaging as needed to assist with the movement of your bowels.  Take with a full glass of water.  Use this product as needed if not relieved by Colace only.   MiraLax (polyethylene  glycol) - Pick up over-the-counter to have on hand.  MiraLax is a solution that will increase the amount of water in your bowels to assist with bowel movements.  Take as directed and can mix with a glass of water, juice, soda, coffee, or tea.  Take if you go more than two days without a movement. Do not use MiraLax more than once per day. Call your doctor if you are still constipated or irregular after using this medication for 7 days in a row.  If you continue to have problems with postoperative constipation, please contact the office for further assistance and recommendations.  If you experience "the worst abdominal pain ever" or develop nausea or vomiting, please contact the office immediatly for further recommendations for treatment.   04/19/17 2149   04/19/17 0000  Diet - low sodium heart healthy     04/19/17 2149   04/19/17 0000  Constipation Prevention    Comments:  Drink plenty of fluids.  Prune juice may be helpful.  You may use a stool softener, such as Colace (over the counter) 100 mg twice a day.  Use MiraLax (over the counter) for constipation as needed.   04/19/17 2149   04/19/17 0000  Increase activity slowly as tolerated  04/19/17 2149   04/19/17 0000  Patient may shower    Comments:  You may shower without a dressing once there is no drainage.  Do not wash over the wound.  If drainage remains, do not shower until drainage stops.   04/19/17 2149   04/19/17 0000  Weight bearing as tolerated    Question Answer Comment  Laterality left   Extremity Lower      04/19/17 2149   04/19/17 0000  Driving restrictions    Comments:  No driving until released by the physician.   04/19/17 2149   04/19/17 0000  Lifting restrictions    Comments:  No lifting until released by the physician.   04/19/17 2149   04/19/17 0000  TED hose    Comments:  Use stockings (TED hose) for 3 weeks on both leg(s).  You may remove them at night for sleeping.   04/19/17 2149   04/19/17 0000  Change  dressing    Comments:  Change dressing daily with sterile 4 x 4 inch gauze dressing and apply TED hose. Do not submerge the incision under water.   04/19/17 2149   04/19/17 0000  Do not put a pillow under the knee. Place it under the heel.     04/19/17 2149   04/19/17 0000  Do not sit on low chairs, stoools or toilet seats, as it may be difficult to get up from low surfaces     04/19/17 2149     Follow-up Information    Gaynelle Arabian, MD. Schedule an appointment as soon as possible for a visit on 05/03/2017.   Specialty:  Orthopedic Surgery Contact information: [address] [place] [address] [phone]           Signed: Arlee Muslim, PA-C Orthopaedic Surgery 04/22/2017, 10:00 AM

## 2017-04-19 NOTE — Evaluation (Signed)
Physical Therapy Evaluation Patient Details Name: [name] MRN: [id number] DOB: May 02, 1951 Today's Date: 04/19/2017   History of Present Illness  s/p L TKA, h/o hardware removal  Clinical Impression  The patient is tolerating ROM and  Performs SLR. Pt admitted with above diagnosis. Pt currently with functional limitations due to the deficits listed below (see PT Problem List).  Pt will benefit from skilled PT to increase their independence and safety with mobility to allow discharge to the venue listed below.       Follow Up Recommendations DC plan and follow up therapy as arranged by surgeon    Equipment Recommendations  Rolling walker with 5" wheels    Recommendations for Other Services       Precautions / Restrictions Precautions Precautions: Knee Required Braces or Orthoses: Knee Immobilizer - Right Knee Immobilizer - Right: Discontinue once straight leg raise with < 10 degree lag Restrictions Other Position/Activity Restrictions: WBAT      Mobility  Bed Mobility               General bed mobility comments: in recliner  Transfers Overall transfer level: Needs assistance Equipment used: Rolling walker (2 wheeled) Transfers: Sit to/from Stand Sit to Stand: Min guard;Supervision         General transfer comment: min guard for safety for first transfer  Ambulation/Gait Ambulation/Gait assistance: Min guard Ambulation Distance (Feet): 100 Feet Assistive device: Rolling walker (2 wheeled) Gait Pattern/deviations: Step-to pattern Gait velocity: decr   General Gait Details: cues for safety. the right heekl does not contact floor which was present  PTA,   Stairs            Wheelchair Mobility    Modified Rankin (Stroke Patients Only)       Balance                                             Pertinent Vitals/Pain Pain Assessment: No/denies pain Pain Score: 4  Pain Location: L knee Pain Descriptors / Indicators:  Discomfort Pain Intervention(s): Monitored during session;Premedicated before session;Repositioned    Home Living Family/patient expects to be discharged to:: Private residence Living Arrangements: Spouse/significant other   Type of Home: House Home Access: Stairs to enter Entrance Stairs-Rails: Right;Left;Can reach both Technical brewer of Steps: 6 Home Layout: One level Home Equipment: Grab bars - toilet;Grab bars - tub/shower;Cane - single point;Crutches Additional Comments: old walker    Prior Function Level of Independence: Independent               Hand Dominance        Extremity/Trunk Assessment   Upper Extremity Assessment Upper Extremity Assessment: Defer to OT evaluation    Lower Extremity Assessment Lower Extremity Assessment: LLE deficits/detail;RLE deficits/detail LLE Deficits / Details: 10-65 knee flexion    Cervical / Trunk Assessment Cervical / Trunk Assessment: Normal  Communication   Communication: No difficulties  Cognition Arousal/Alertness: Awake/alert Behavior During Therapy: WFL for tasks assessed/performed Overall Cognitive Status: Within Functional Limits for tasks assessed                                        General Comments      Exercises Total Joint Exercises Ankle Circles/Pumps: AROM;Both;10 reps;Supine Quad Sets: AROM;Both;10 reps;Supine Towel Squeeze: AROM;Right;10 reps;Supine  Heel Slides: AAROM;Right;10 reps;Supine Hip ABduction/ADduction: AROM;Right;10 reps;Supine Straight Leg Raises: AROM;Right;10 reps;Supine   Assessment/Plan    PT Assessment Patient needs continued PT services  PT Problem List Decreased strength;Decreased range of motion;Decreased activity tolerance;Decreased mobility;Decreased knowledge of precautions;Decreased safety awareness;Decreased knowledge of use of DME;Pain       PT Treatment Interventions DME instruction;Gait training;Stair training;Functional mobility  training;Therapeutic activities;Therapeutic exercise;Patient/family education    PT Goals (Current goals can be found in the Care Plan section)  Acute Rehab PT Goals Patient Stated Goal: walk without pain PT Goal Formulation: With patient/family Time For Goal Achievement: 04/21/17 Potential to Achieve Goals: Good    Frequency 7X/week   Barriers to discharge        Co-evaluation               AM-PAC PT "6 Clicks" Daily Activity  Outcome Measure Difficulty turning over in bed (including adjusting bedclothes, sheets and blankets)?: Unable Difficulty moving from lying on back to sitting on the side of the bed? : Unable Difficulty sitting down on and standing up from a chair with arms (e.g., wheelchair, bedside commode, etc,.)?: Unable Help needed moving to and from a bed to chair (including a wheelchair)?: A Little Help needed walking in hospital room?: A Little Help needed climbing 3-5 steps with a railing? : A Lot 6 Click Score: 11    End of Session Equipment Utilized During Treatment: Gait belt Activity Tolerance: Patient tolerated treatment well Patient left: in chair;with call bell/phone within reach;with family/visitor present Nurse Communication: Mobility status PT Visit Diagnosis: Pain;Difficulty in walking, not elsewhere classified (R26.2) Pain - Right/Left: Left Pain - part of body: Knee    Time: 1030-1100 PT Time Calculation (min) (ACUTE ONLY): 30 min   Charges:   PT Evaluation $PT Eval Low Complexity: 1 Low PT Treatments $Gait Training: 8-22 mins   PT G CodesTresa Endo PT [phone]   Claretha Cooper 04/19/2017, 1:07 PM

## 2017-04-19 NOTE — Evaluation (Signed)
Occupational Therapy Evaluation Patient Details Name: [name] MRN: [id number] DOB: 1950/11/10 Today's Date: 04/19/2017    History of Present Illness s/p L TKA, h/o hardware removal   Clinical Impression   This 66 year old female was admitted for the above sx. All education was completed. No further OT is needed at this time    Follow Up Recommendations  No OT follow up    Equipment Recommendations  None recommended by OT    Recommendations for Other Services       Precautions / Restrictions Precautions Precautions: Knee Restrictions Weight Bearing Restrictions: No Other Position/Activity Restrictions: WBAT      Mobility Bed Mobility Overal bed mobility: Modified Independent             General bed mobility comments: HOB raised  Transfers Overall transfer level: Needs assistance Equipment used: Rolling walker (2 wheeled) Transfers: Sit to/from Stand Sit to Stand: Min guard;Supervision         General transfer comment: min guard for safety for first transfer    Balance                                           ADL either performed or assessed with clinical judgement   ADL Overall ADL's : Needs assistance/impaired Eating/Feeding: Independent   Grooming: Oral care;Supervision/safety;Standing   Upper Body Bathing: Set up;Sitting   Lower Body Bathing: Supervison/ safety;Sit to/from stand   Upper Body Dressing : Set up;Sitting   Lower Body Dressing: Minimal assistance;Sit to/from stand   Toilet Transfer: Min guard;Ambulation;Comfort height toilet;Grab bars;RW   Toileting- Clothing Manipulation and Hygiene: Supervision/safety;Sit to/from stand         General ADL Comments: ambulated to bathroom, used commode, brushed her teeth and donned underwear.  Pt verbalizes understanding of shower transfer. She did not feel she needed to practice     Vision         Perception     Praxis      Pertinent Vitals/Pain Pain  Assessment: 0-10 Pain Score: 1  Pain Location: L knee Pain Descriptors / Indicators: Discomfort Pain Intervention(s): Limited activity within patient's tolerance;Monitored during session;Premedicated before session;Repositioned (declined further ice)     Hand Dominance     Extremity/Trunk Assessment Upper Extremity Assessment Upper Extremity Assessment: Overall WFL for tasks assessed           Communication Communication Communication: No difficulties   Cognition Arousal/Alertness: Awake/alert Behavior During Therapy: WFL for tasks assessed/performed Overall Cognitive Status: Within Functional Limits for tasks assessed                                     General Comments       Exercises     Shoulder Instructions      Home Living Family/patient expects to be discharged to:: Private residence Living Arrangements: Spouse/significant other Available Help at Discharge: Family Type of Home: House             Bathroom Shower/Tub: Walk-in shower   Bathroom Toilet: Handicapped height     Home Equipment: Grab bars - toilet;Grab bars - tub/shower;Cane - single point;Crutches   Additional Comments: old walker      Prior Functioning/Environment Level of Independence: Independent  OT Problem List:        OT Treatment/Interventions:      OT Goals(Current goals can be found in the care plan section) Acute Rehab OT Goals Patient Stated Goal: get other knee taken care of OT Goal Formulation: All assessment and education complete, DC therapy  OT Frequency:     Barriers to D/C:            Co-evaluation              AM-PAC PT "6 Clicks" Daily Activity     Outcome Measure Help from another person eating meals?: None Help from another person taking care of personal grooming?: A Little Help from another person toileting, which includes using toliet, bedpan, or urinal?: A Little Help from another person bathing (including  washing, rinsing, drying)?: A Little Help from another person to put on and taking off regular upper body clothing?: A Little Help from another person to put on and taking off regular lower body clothing?: A Little 6 Click Score: 19   End of Session CPM Left Knee CPM Left Knee: Off  Activity Tolerance: Patient tolerated treatment well Patient left: in chair;with call bell/phone within reach  OT Visit Diagnosis: Pain Pain - Right/Left: Left Pain - part of body: Knee                Time: [id number] OT Time Calculation (min): 22 min Charges:  OT General Charges $OT Visit: 1 Procedure OT Evaluation $OT Eval Low Complexity: 1 Procedure G-Codes:     [place], OTR/L [phone] 04/19/2017  [name] 04/19/2017, 10:16 AM

## 2017-04-19 NOTE — Progress Notes (Signed)
Spoke with patient and spouse. Plan is for Virtual PT, already arranged. Needs RW and 3n1, contacted [facility] to deliver to room. [phone]

## 2017-04-19 NOTE — Progress Notes (Signed)
   Subjective: 1 Day Post-Op Procedure(s) (LRB): LEFT TOTAL KNEE ARTHROPLASTY (Left) Patient reports pain as mild.   Patient seen in rounds with Dr. Wynelle Link. Patient is well, but has had some minor complaints of pain in the knee, requiring pain medications We will start therapy today.  Plan is to go Home after hospital stay.  Objective: Vital signs in last 24 hours: Temp:  [97.3 F (36.3 C)-98.6 F (37 C)] 98.6 F (37 C) (08/21 2034) Pulse Rate:  [56-74] 56 (08/21 2034) Resp:  [15-17] 17 (08/21 2034) BP: (109-143)/(45-61) 120/53 (08/21 2034) SpO2:  [98 %-100 %] 100 % (08/21 2034)  Intake/Output from previous day:  Intake/Output Summary (Last 24 hours) at 04/19/17 2144 Last data filed at 04/19/17 2035  Gross per 24 hour  Intake           2912.5 ml  Output             2580 ml  Net            332.5 ml    Intake/Output this shift: Total I/O In: 150 [P.O.:150] Out: -   Labs:  Recent Labs  04/19/17 0752  HGB 10.8*    Recent Labs  04/19/17 0752  WBC 18.3*  RBC 3.37*  HCT 31.7*  PLT 267    Recent Labs  04/19/17 0752  NA 136  K 3.9  CL 105  CO2 23  BUN 10  CREATININE 0.71  GLUCOSE 121*  CALCIUM 8.7*   No results for input(s): LABPT, INR in the last 72 hours.  EXAM General - Patient is Alert, Appropriate and Oriented Extremity - Neurovascular intact Sensation intact distally Intact pulses distally Dorsiflexion/Plantar flexion intact Dressing - dressing C/D/I Motor Function - intact, moving foot and toes well on exam.  Hemovac pulled without difficulty.  Past Medical History:  Diagnosis Date  . Anxiety   . Arthritis   . Claustrophobia   . Hypothyroidism     Assessment/Plan: 1 Day Post-Op Procedure(s) (LRB): LEFT TOTAL KNEE ARTHROPLASTY (Left) Principal Problem:   OA (osteoarthritis) of knee  Estimated body mass index is 33.13 kg/m as calculated from the following:   Height as of this encounter: 5\' 4"  (1.626 m).   Weight as of this  encounter: 87.5 kg (193 lb). Up with therapy Plan for discharge tomorrow  DVT Prophylaxis - Xarelto Weight-Bearing as tolerated to left leg D/C O2 and Pulse OX and try on Room Air  Arlee Muslim, PA-C Orthopaedic Surgery 04/19/2017, 9:44 PM

## 2017-04-20 LAB — CBC
HCT: 26.5 % — ABNORMAL LOW (ref 36.0–46.0)
Hemoglobin: 9.2 g/dL — ABNORMAL LOW (ref 12.0–15.0)
MCH: 33 pg (ref 26.0–34.0)
MCHC: 34.7 g/dL (ref 30.0–36.0)
MCV: 95 fL (ref 78.0–100.0)
Platelets: 241 10*3/uL (ref 150–400)
RBC: 2.79 MIL/uL — ABNORMAL LOW (ref 3.87–5.11)
RDW: 13.5 % (ref 11.5–15.5)
WBC: 16.4 10*3/uL — ABNORMAL HIGH (ref 4.0–10.5)

## 2017-04-20 LAB — BASIC METABOLIC PANEL
Anion gap: 6 (ref 5–15)
BUN: 12 mg/dL (ref 6–20)
CO2: 26 mmol/L (ref 22–32)
Calcium: 8.5 mg/dL — ABNORMAL LOW (ref 8.9–10.3)
Chloride: 107 mmol/L (ref 101–111)
Creatinine, Ser: 0.85 mg/dL (ref 0.44–1.00)
GFR calc Af Amer: >60 mL/min (ref >60)
GFR calc non Af Amer: >60 mL/min (ref >60)
Glucose, Bld: 118 mg/dL — ABNORMAL HIGH (ref 65–99)
Potassium: 4.1 mmol/L (ref 3.5–5.1)
Sodium: 139 mmol/L (ref 135–145)

## 2017-04-20 NOTE — Progress Notes (Signed)
Physical Therapy Treatment Patient Details Name: [name] MRN: [id number] DOB: [birth year] Today's Date: 04/20/2017    History of Present Illness s/p L TKA, h/o hardware removal    PT Comments    POD # 2 Assisted with amb in hallway, practiced stairs then perfomed all supine TKR TE's following HEP.  Instructed VERA was primary and handout was secondary.  Instructed on use of ICE.    Follow Up Recommendations  DC plan and follow up therapy as arranged by surgeon     Equipment Recommendations  Rolling walker with 5" wheels    Recommendations for Other Services       Precautions / Restrictions Precautions Precautions: Fall Restrictions Weight Bearing Restrictions: No Other Position/Activity Restrictions: WBAT    Mobility  Bed Mobility               General bed mobility comments: OOB in recliner  Transfers Overall transfer level: Needs assistance Equipment used: Rolling walker (2 wheeled) Transfers: Sit to/from Stand Sit to Stand: Supervision         General transfer comment: 25% VC's on safety with turns  Ambulation/Gait Ambulation/Gait assistance: Supervision Ambulation Distance (Feet): 125 Feet Assistive device: Rolling walker (2 wheeled) Gait Pattern/deviations: Step-to pattern;Step-through pattern Gait velocity: decr   General Gait Details: cues for safety. used used shoes with a lift on right   Stairs    4 steps one rail forward         Wheelchair Mobility    Modified Rankin (Stroke Patients Only)       Balance                                            Cognition Arousal/Alertness: Awake/alert Behavior During Therapy: WFL for tasks assessed/performed Overall Cognitive Status: Within Functional Limits for tasks assessed                                        Exercises   Total Knee Replacement TE's 10 reps B LE ankle pumps 10 reps towel squeezes 10 reps knee presses 10 reps heel slides   10 reps SAQ's 10 reps SLR's 10 reps ABD Followed by ICE     General Comments        Pertinent Vitals/Pain Pain Score: 4  Pain Location: L knee Pain Descriptors / Indicators: Discomfort;Grimacing;Operative site guarding Pain Intervention(s): Limited activity within patient's tolerance;Monitored during session;Ice applied;Repositioned    Home Living                      Prior Function            PT Goals (current goals can now be found in the care plan section) Progress towards PT goals: Progressing toward goals    Frequency    7X/week      PT Plan Current plan remains appropriate    Co-evaluation              AM-PAC PT "6 Clicks" Daily Activity  Outcome Measure  Difficulty turning over in bed (including adjusting bedclothes, sheets and blankets)?: None Difficulty moving from lying on back to sitting on the side of the bed? : None Difficulty sitting down on and standing up from a chair with arms (e.g., wheelchair, bedside commode, etc,.)?: A  Little Help needed moving to and from a bed to chair (including a wheelchair)?: A Little Help needed walking in hospital room?: A Little Help needed climbing 3-5 steps with a railing? : A Little 6 Click Score: 20    End of Session Equipment Utilized During Treatment: Gait belt Activity Tolerance: Patient tolerated treatment well Patient left: with call bell/phone within reach;with family/visitor present;with bed alarm set Nurse Communication: Mobility status PT Visit Diagnosis: Pain;Difficulty in walking, not elsewhere classified (R26.2) Pain - Right/Left: Left Pain - part of body: Knee     Time: 4158-3094 PT Time Calculation (min) (ACUTE ONLY): 26 min  Charges:  $Gait Training: 8-22 mins $Therapeutic Exercise: 8-22 mins                    G Codes:       Rica Koyanagi  PTA [facility]  Acute  Rehab Pager      [phone]

## 2017-04-20 NOTE — Progress Notes (Signed)
   Subjective: 2 Days Post-Op Procedure(s) (LRB): LEFT TOTAL KNEE ARTHROPLASTY (Left) Patient reports pain as mild.   Patient seen in rounds by Dr. Wynelle Link. Patient is well, and has had no acute complaints or problems Patient is ready to go home  Objective: Vital signs in last 24 hours: Temp:  [97.3 F (36.3 C)-98.6 F (37 C)] 98 F (36.7 C) (08/22 0543) Pulse Rate:  [56-69] 65 (08/22 0543) Resp:  [16-18] 18 (08/22 0543) BP: (119-143)/(53-61) 119/56 (08/22 0543) SpO2:  [97 %-100 %] 97 % (08/22 0543)  Intake/Output from previous day:  Intake/Output Summary (Last 24 hours) at 04/20/17 1214 Last data filed at 04/20/17 0929  Gross per 24 hour  Intake             1740 ml  Output                0 ml  Net             1740 ml    Intake/Output this shift: Total I/O In: 240 [P.O.:240] Out: -   Labs:  Recent Labs  04/19/17 0752 04/20/17 0551  HGB 10.8* 9.2*    Recent Labs  04/19/17 0752 04/20/17 0551  WBC 18.3* 16.4*  RBC 3.37* 2.79*  HCT 31.7* 26.5*  PLT 267 241    Recent Labs  04/19/17 0752 04/20/17 0551  NA 136 139  K 3.9 4.1  CL 105 107  CO2 23 26  BUN 10 12  CREATININE 0.71 0.85  GLUCOSE 121* 118*  CALCIUM 8.7* 8.5*   No results for input(s): LABPT, INR in the last 72 hours.  EXAM: General - Patient is Alert and Appropriate Extremity - Neurovascular intact Sensation intact distally Incision - clean, dry, no drainage Motor Function - intact, moving foot and toes well on exam.   Assessment/Plan: 2 Days Post-Op Procedure(s) (LRB): LEFT TOTAL KNEE ARTHROPLASTY (Left) Procedure(s) (LRB): LEFT TOTAL KNEE ARTHROPLASTY (Left) Past Medical History:  Diagnosis Date  . Anxiety   . Arthritis   . Claustrophobia   . Hypothyroidism    Principal Problem:   OA (osteoarthritis) of knee  Estimated body mass index is 33.13 kg/m as calculated from the following:   Height as of this encounter: 5\' 4"  (1.626 m).   Weight as of this encounter: 87.5 kg  (193 lb). Up with therapy Diet - Regular diet Follow up - in 2 weeks Activity - WBAT Disposition - Home Condition Upon Discharge - Good D/C Meds - See DC Summary DVT Prophylaxis - Xarelto  Arlee Muslim, PA-C Orthopaedic Surgery 04/20/2017, 12:14 PM

## 2017-05-03 DIAGNOSIS — M1712 Unilateral primary osteoarthritis, left knee: Secondary | ICD-10-CM | POA: Diagnosis not present

## 2017-05-25 DIAGNOSIS — Z471 Aftercare following joint replacement surgery: Secondary | ICD-10-CM | POA: Diagnosis not present

## 2017-05-25 DIAGNOSIS — M25561 Pain in right knee: Secondary | ICD-10-CM | POA: Diagnosis not present

## 2017-05-25 DIAGNOSIS — G8929 Other chronic pain: Secondary | ICD-10-CM | POA: Diagnosis not present

## 2017-05-25 DIAGNOSIS — Z96652 Presence of left artificial knee joint: Secondary | ICD-10-CM | POA: Diagnosis not present

## 2017-05-25 DIAGNOSIS — M1712 Unilateral primary osteoarthritis, left knee: Secondary | ICD-10-CM | POA: Diagnosis not present

## 2017-06-08 DIAGNOSIS — Z299 Encounter for prophylactic measures, unspecified: Secondary | ICD-10-CM | POA: Diagnosis not present

## 2017-06-08 DIAGNOSIS — E7800 Pure hypercholesterolemia, unspecified: Secondary | ICD-10-CM | POA: Diagnosis not present

## 2017-06-08 DIAGNOSIS — Z1331 Encounter for screening for depression: Secondary | ICD-10-CM | POA: Diagnosis not present

## 2017-06-08 DIAGNOSIS — Z7189 Other specified counseling: Secondary | ICD-10-CM | POA: Diagnosis not present

## 2017-06-08 DIAGNOSIS — Z79899 Other long term (current) drug therapy: Secondary | ICD-10-CM | POA: Diagnosis not present

## 2017-06-08 DIAGNOSIS — Z1211 Encounter for screening for malignant neoplasm of colon: Secondary | ICD-10-CM | POA: Diagnosis not present

## 2017-06-08 DIAGNOSIS — E039 Hypothyroidism, unspecified: Secondary | ICD-10-CM | POA: Diagnosis not present

## 2017-06-08 DIAGNOSIS — Z0000 Encounter for general adult medical examination without abnormal findings: Secondary | ICD-10-CM | POA: Diagnosis not present

## 2017-06-08 DIAGNOSIS — Z1339 Encounter for screening examination for other mental health and behavioral disorders: Secondary | ICD-10-CM | POA: Diagnosis not present

## 2017-06-08 DIAGNOSIS — E669 Obesity, unspecified: Secondary | ICD-10-CM | POA: Diagnosis not present

## 2017-06-14 DIAGNOSIS — M1712 Unilateral primary osteoarthritis, left knee: Secondary | ICD-10-CM | POA: Diagnosis not present

## 2017-06-14 DIAGNOSIS — Z471 Aftercare following joint replacement surgery: Secondary | ICD-10-CM | POA: Diagnosis not present

## 2017-06-14 DIAGNOSIS — Z96652 Presence of left artificial knee joint: Secondary | ICD-10-CM | POA: Diagnosis not present

## 2017-06-14 DIAGNOSIS — M25561 Pain in right knee: Secondary | ICD-10-CM | POA: Diagnosis not present

## 2017-07-20 DIAGNOSIS — M1712 Unilateral primary osteoarthritis, left knee: Secondary | ICD-10-CM | POA: Diagnosis not present

## 2017-07-20 DIAGNOSIS — M25571 Pain in right ankle and joints of right foot: Secondary | ICD-10-CM | POA: Diagnosis not present

## 2018-01-26 ENCOUNTER — Other Ambulatory Visit: Payer: Self-pay | Admitting: Obstetrics and Gynecology

## 2018-01-26 DIAGNOSIS — Z1231 Encounter for screening mammogram for malignant neoplasm of breast: Secondary | ICD-10-CM

## 2018-02-17 ENCOUNTER — Ambulatory Visit
Admission: RE | Admit: 2018-02-17 | Discharge: 2018-02-17 | Disposition: A | Discharge status: Home / Self Care | Payer: PRIVATE HEALTH INSURANCE | Source: Ambulatory Visit | Attending: Obstetrics and Gynecology | Admitting: Obstetrics and Gynecology

## 2018-02-17 DIAGNOSIS — Z1231 Encounter for screening mammogram for malignant neoplasm of breast: Secondary | ICD-10-CM

## 2018-06-06 DIAGNOSIS — M19012 Primary osteoarthritis, left shoulder: Secondary | ICD-10-CM | POA: Insufficient documentation

## 2018-06-06 DIAGNOSIS — IMO0002 Reserved for concepts with insufficient information to code with codable children: Secondary | ICD-10-CM | POA: Insufficient documentation

## 2019-09-12 ENCOUNTER — Other Ambulatory Visit: Payer: Self-pay

## 2019-09-12 ENCOUNTER — Ambulatory Visit: Payer: Medicare Other | Attending: Internal Medicine

## 2019-09-12 DIAGNOSIS — Z20822 Contact with and (suspected) exposure to covid-19: Secondary | ICD-10-CM

## 2019-09-13 LAB — NOVEL CORONAVIRUS, NAA: SARS-CoV-2, NAA: NOT DETECTED

## 2020-05-26 HISTORY — PX: COLONOSCOPY: SHX174

## 2020-06-04 ENCOUNTER — Other Ambulatory Visit: Payer: Self-pay | Admitting: General Surgery

## 2020-06-04 DIAGNOSIS — C210 Malignant neoplasm of anus, unspecified: Secondary | ICD-10-CM

## 2020-06-09 ENCOUNTER — Encounter: Payer: Self-pay | Admitting: *Deleted

## 2020-06-10 ENCOUNTER — Other Ambulatory Visit: Payer: Self-pay | Admitting: *Deleted

## 2020-06-10 ENCOUNTER — Inpatient Hospital Stay: Payer: Medicare Other | Attending: Internal Medicine | Admitting: Internal Medicine

## 2020-06-10 ENCOUNTER — Inpatient Hospital Stay: Payer: Medicare Other

## 2020-06-10 ENCOUNTER — Encounter: Payer: Self-pay | Admitting: General Surgery

## 2020-06-10 ENCOUNTER — Encounter: Payer: Self-pay | Admitting: Internal Medicine

## 2020-06-10 ENCOUNTER — Other Ambulatory Visit: Payer: Self-pay

## 2020-06-10 ENCOUNTER — Ambulatory Visit
Admission: RE | Admit: 2020-06-10 | Discharge: 2020-06-10 | Disposition: A | Discharge status: Home / Self Care | Payer: Medicare Other | Source: Ambulatory Visit | Attending: Radiation Oncology | Admitting: Radiation Oncology

## 2020-06-10 ENCOUNTER — Encounter: Payer: Self-pay | Admitting: *Deleted

## 2020-06-10 VITALS — BP 142/84 | HR 84 | Temp 98.0°F | Resp 16 | Wt 198.1 lb

## 2020-06-10 DIAGNOSIS — Z79899 Other long term (current) drug therapy: Secondary | ICD-10-CM | POA: Insufficient documentation

## 2020-06-10 DIAGNOSIS — Z96652 Presence of left artificial knee joint: Secondary | ICD-10-CM | POA: Diagnosis not present

## 2020-06-10 DIAGNOSIS — C210 Malignant neoplasm of anus, unspecified: Secondary | ICD-10-CM | POA: Insufficient documentation

## 2020-06-10 DIAGNOSIS — Z780 Asymptomatic menopausal state: Secondary | ICD-10-CM

## 2020-06-10 DIAGNOSIS — Z803 Family history of malignant neoplasm of breast: Secondary | ICD-10-CM

## 2020-06-10 LAB — COMPREHENSIVE METABOLIC PANEL
ALT: 22 U/L (ref 0–44)
AST: 32 U/L (ref 15–41)
Albumin: 4.6 g/dL (ref 3.5–5.0)
Alkaline Phosphatase: 71 U/L (ref 38–126)
Anion gap: 10 (ref 5–15)
BUN: 12 mg/dL (ref 8–23)
CO2: 26 mmol/L (ref 22–32)
Calcium: 9.1 mg/dL (ref 8.9–10.3)
Chloride: 101 mmol/L (ref 98–111)
Creatinine, Ser: 0.95 mg/dL (ref 0.44–1.00)
GFR, Estimated: >60 mL/min (ref >60)
Glucose, Bld: 96 mg/dL (ref 70–99)
Potassium: 4.1 mmol/L (ref 3.5–5.1)
Sodium: 137 mmol/L (ref 135–145)
Total Bilirubin: 0.9 mg/dL (ref 0.3–1.2)
Total Protein: 7.9 g/dL (ref 6.5–8.1)

## 2020-06-10 LAB — CBC WITH DIFFERENTIAL/PLATELET
Abs Immature Granulocytes: 0.04 10*3/uL (ref 0.00–0.07)
Basophils Absolute: 0.1 10*3/uL (ref 0.0–0.1)
Basophils Relative: 1 %
Eosinophils Absolute: 0.1 10*3/uL (ref 0.0–0.5)
Eosinophils Relative: 1 %
HCT: 40.2 % (ref 36.0–46.0)
Hemoglobin: 13.7 g/dL (ref 12.0–15.0)
Immature Granulocytes: 0 %
Lymphocytes Relative: 24 %
Lymphs Abs: 2.4 10*3/uL (ref 0.7–4.0)
MCH: 31.9 pg (ref 26.0–34.0)
MCHC: 34.1 g/dL (ref 30.0–36.0)
MCV: 93.5 fL (ref 80.0–100.0)
Monocytes Absolute: 0.9 10*3/uL (ref 0.1–1.0)
Monocytes Relative: 9 %
Neutro Abs: 6.8 10*3/uL (ref 1.7–7.7)
Neutrophils Relative %: 65 %
Platelets: 357 10*3/uL (ref 150–400)
RBC: 4.3 MIL/uL (ref 3.87–5.11)
RDW: 12.1 % (ref 11.5–15.5)
WBC: 10.3 10*3/uL (ref 4.0–10.5)
nRBC: 0 % (ref 0.0–0.2)

## 2020-06-10 NOTE — Assessment & Plan Note (Addendum)
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   #  #Discussed use of definitive concurrent chemoradiation for treatment of localized anal cancer is cure ; duration being approximately 6 weeks.  Chemotherapy includes continuous 5-FU infusion Monday through Friday plus mitomycin day 1 day 29.  Discussed the at length schedule /logistics of continuous 5-FU; need for a port placement etc. we will make a referral to Dr. Bary Castilla.  Discussed the than usual side effects of mitomycin including-/severe neutropenia/sores in the mouth.  Patient currently s/p evaluation with Dr. Donella Stade.  Discussed with Dr. Donella Stade.  I spoke at length with the patient's neice- regarding the patient's clinical status/plan of care.  Family agreement.   Thank you Dr.[name] for allowing me to participate in the care of your pleasant patient. Please do not hesitate to contact me with questions or concerns in the interim.  # DISPOSITION: # Labs today- please order cbc/cmp # referral to Dr.[name] re: port placement # chemo education # follow up on 10/20-MD; no labs-coordinate with radiation appt-Dr.B
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
## 2020-06-10 NOTE — Progress Notes (Signed)
START ON PATHWAY REGIMEN - Anal Carcinoma     Chemotherapy concurrent with RT:     Mitomycin      Fluorouracil   **Always confirm dose/schedule in your pharmacy ordering system**  Patient Characteristics: Anal Canal Tumors, Newly Diagnosed - Locoregional Disease (Clinical Staging) Therapeutic Status: Newly Diagnosed - Locoregional Disease (Clinical Staging) AJCC T Category: cT2 AJCC N Category: cN0 AJCC 8 Stage Grouping: IIA AJCC M Category: cM0 Intent of Therapy: Curative Intent, Not Discussed with Patient
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
## 2020-06-11 ENCOUNTER — Other Ambulatory Visit: Payer: Self-pay | Admitting: General Surgery

## 2020-06-11 DIAGNOSIS — F409 Phobic anxiety disorder, unspecified: Secondary | ICD-10-CM

## 2020-06-11 MED ORDER — DIAZEPAM 10 MG PO TABS: ORAL_TABLET | ORAL | 0 refills | Status: DC

## 2020-06-11 NOTE — Addendum Note (Signed)
Addended byHervey Ard on: 06/11/2020 09:07 AM   Modules accepted: Orders

## 2020-06-12 ENCOUNTER — Inpatient Hospital Stay: Payer: Medicare Other

## 2020-06-16 NOTE — Patient Instructions (Signed)
Mitomycin injection What is this medicine? MITOMYCIN (mye toe MYE sin) is a chemotherapy drug. This medicine is used to treat cancer of the stomach and pancreas. This medicine may be used for other purposes; ask your health care provider or pharmacist if you have questions. COMMON BRAND NAME(S): Mutamycin What should I tell my health care provider before I take this medicine? They need to know if you have any of these conditions:  bleeding disorders  infection (especially a viral infection such as chickenpox, cold sores, or herpes)  low blood counts, like white cells, platelets, or red blood cells  kidney disease  an unusual or allergic reaction to mitomycin, other medicines, foods, dyes, or preservatives  pregnant or trying to get pregnant  breast-feeding How should I use this medicine? This drug is given as an injection or infusion into a vein. It is administered in a hospital or clinic by a specially trained health care professional. Talk to your pediatrician regarding the use of this medicine in children. Special care may be needed. Overdosage: If you think you have taken too much of this medicine contact a poison control center or emergency room at once. NOTE: This medicine is only for you. Do not share this medicine with others. What if I miss a dose? It is important not to miss your dose. Call your doctor or health care professional if you are unable to keep an appointment. What may interact with this medicine? Interactions are not expected. This list may not describe all possible interactions. Give your health care provider a list of all the medicines, herbs, non-prescription drugs, or dietary supplements you use. Also tell them if you smoke, drink alcohol, or use illegal drugs. Some items may interact with your medicine. What should I watch for while using this medicine? Your condition will be monitored carefully while you are receiving this medicine. You will need important  blood work done while you are taking this medicine. This drug may make you feel generally unwell. This is not uncommon, as chemotherapy can affect healthy cells as well as cancer cells. Report any side effects. Continue your course of treatment even though you feel ill unless your doctor tells you to stop. Call your doctor or health care professional for advice if you get a fever, chills or sore throat, or other symptoms of a cold or flu. Do not treat yourself. This drug decreases your body's ability to fight infections. Try to avoid being around people who are sick. This medicine may increase your risk to bruise or bleed. Call your doctor or health care professional if you notice any unusual bleeding. Be careful brushing and flossing your teeth or using a toothpick because you may get an infection or bleed more easily. If you have any dental work done, tell your dentist you are receiving this medicine. Avoid taking products that contain aspirin, acetaminophen, ibuprofen, naproxen, or ketoprofen unless instructed by your doctor. These medicines may hide a fever. Do not become pregnant while taking this medicine. Women should inform their doctor if they wish to become pregnant or think they might be pregnant. There is a potential for serious side effects to an unborn child. Talk to your health care professional or pharmacist for more information. Do not breast-feed an infant while taking this medicine. What side effects may I notice from receiving this medicine? Side effects that you should report to your doctor or health care professional as soon as possible:  allergic reactions like skin rash, itching or hives,  swelling of the face, lips, or tongue  breathing problems  pain, redness, or irritation at site where injected  signs and symptoms of bleeding such as bloody or black, tarry stools; red or dark brown urine; spitting up blood or brown material that looks like coffee grounds; red spots on the  skin; unusual bruising or bleeding from the eyes, gums, or nose  signs and symptoms of infection like fever; chills; cough; sore throat; pain or trouble passing urine  signs and symptoms of kidney injury like trouble passing urine or change in the amount of urine  signs and symptoms of low red blood cells or anemia such as unusually weak or tired; feeling faint or lightheaded; falls; breathing problems Side effects that usually do not require medical attention (report to your doctor or health care professional if they continue or are bothersome):  green to blue color of urine  hair loss  loss of appetite  mouth sores  nausea, vomiting This list may not describe all possible side effects. Call your doctor for medical advice about side effects. You may report side effects to FDA at 1-800-FDA-1088. Where should I keep my medicine? This drug is given in a hospital or clinic and will not be stored at home. NOTE: This sheet is a summary. It may not cover all possible information. If you have questions about this medicine, talk to your doctor, pharmacist, or health care provider.  2020 Elsevier/Gold Standard (2019-04-03 16:33:50) Fluorouracil, 5-FU injection What is this medicine? FLUOROURACIL, 5-FU (flure oh YOOR a sil) is a chemotherapy drug. It slows the growth of cancer cells. This medicine is used to treat many types of cancer like breast cancer, colon or rectal cancer, pancreatic cancer, and stomach cancer. This medicine may be used for other purposes; ask your health care provider or pharmacist if you have questions. COMMON BRAND NAME(S): Adrucil What should I tell my health care provider before I take this medicine? They need to know if you have any of these conditions:  blood disorders  dihydropyrimidine dehydrogenase (DPD) deficiency  infection (especially a virus infection such as chickenpox, cold sores, or herpes)  kidney disease  liver disease  malnourished, poor  nutrition  recent or ongoing radiation therapy  an unusual or allergic reaction to fluorouracil, other chemotherapy, other medicines, foods, dyes, or preservatives  pregnant or trying to get pregnant  breast-feeding How should I use this medicine? This drug is given as an infusion or injection into a vein. It is administered in a hospital or clinic by a specially trained health care professional. Talk to your pediatrician regarding the use of this medicine in children. Special care may be needed. Overdosage: If you think you have taken too much of this medicine contact a poison control center or emergency room at once. NOTE: This medicine is only for you. Do not share this medicine with others. What if I miss a dose? It is important not to miss your dose. Call your doctor or health care professional if you are unable to keep an appointment. What may interact with this medicine?  allopurinol  cimetidine  dapsone  digoxin  hydroxyurea  leucovorin  levamisole  medicines for seizures like ethotoin, fosphenytoin, phenytoin  medicines to increase blood counts like filgrastim, pegfilgrastim, sargramostim  medicines that treat or prevent blood clots like warfarin, enoxaparin, and dalteparin  methotrexate  metronidazole  pyrimethamine  some other chemotherapy drugs like busulfan, cisplatin, estramustine, vinblastine  trimethoprim  trimetrexate  vaccines Talk to your doctor or  health care professional before taking any of these medicines:  acetaminophen  aspirin  ibuprofen  ketoprofen  naproxen This list may not describe all possible interactions. Give your health care provider a list of all the medicines, herbs, non-prescription drugs, or dietary supplements you use. Also tell them if you smoke, drink alcohol, or use illegal drugs. Some items may interact with your medicine. What should I watch for while using this medicine? Visit your doctor for checks on your  progress. This drug may make you feel generally unwell. This is not uncommon, as chemotherapy can affect healthy cells as well as cancer cells. Report any side effects. Continue your course of treatment even though you feel ill unless your doctor tells you to stop. In some cases, you may be given additional medicines to help with side effects. Follow all directions for their use. Call your doctor or health care professional for advice if you get a fever, chills or sore throat, or other symptoms of a cold or flu. Do not treat yourself. This drug decreases your body's ability to fight infections. Try to avoid being around people who are sick. This medicine may increase your risk to bruise or bleed. Call your doctor or health care professional if you notice any unusual bleeding. Be careful brushing and flossing your teeth or using a toothpick because you may get an infection or bleed more easily. If you have any dental work done, tell your dentist you are receiving this medicine. Avoid taking products that contain aspirin, acetaminophen, ibuprofen, naproxen, or ketoprofen unless instructed by your doctor. These medicines may hide a fever. Do not become pregnant while taking this medicine. Women should inform their doctor if they wish to become pregnant or think they might be pregnant. There is a potential for serious side effects to an unborn child. Talk to your health care professional or pharmacist for more information. Do not breast-feed an infant while taking this medicine. Men should inform their doctor if they wish to father a child. This medicine may lower sperm counts. Do not treat diarrhea with over the counter products. Contact your doctor if you have diarrhea that lasts more than 2 days or if it is severe and watery. This medicine can make you more sensitive to the sun. Keep out of the sun. If you cannot avoid being in the sun, wear protective clothing and use sunscreen. Do not use sun lamps or  tanning beds/booths. What side effects may I notice from receiving this medicine? Side effects that you should report to your doctor or health care professional as soon as possible:  allergic reactions like skin rash, itching or hives, swelling of the face, lips, or tongue  low blood counts - this medicine may decrease the number of white blood cells, red blood cells and platelets. You may be at increased risk for infections and bleeding.  signs of infection - fever or chills, cough, sore throat, pain or difficulty passing urine  signs of decreased platelets or bleeding - bruising, pinpoint red spots on the skin, black, tarry stools, blood in the urine  signs of decreased red blood cells - unusually weak or tired, fainting spells, lightheadedness  breathing problems  changes in vision  chest pain  mouth sores  nausea and vomiting  pain, swelling, redness at site where injected  pain, tingling, numbness in the hands or feet  redness, swelling, or sores on hands or feet  stomach pain  unusual bleeding Side effects that usually do not require  medical attention (report to your doctor or health care professional if they continue or are bothersome):  changes in finger or toe nails  diarrhea  dry or itchy skin  hair loss  headache  loss of appetite  sensitivity of eyes to the light  stomach upset  unusually teary eyes This list may not describe all possible side effects. Call your doctor for medical advice about side effects. You may report side effects to FDA at 1-800-FDA-1088. Where should I keep my medicine? This drug is given in a hospital or clinic and will not be stored at home. NOTE: This sheet is a summary. It may not cover all possible information. If you have questions about this medicine, talk to your doctor, pharmacist, or health care provider.  2020 Elsevier/Gold Standard (2007-12-20 13:53:16)

## 2020-06-17 ENCOUNTER — Ambulatory Visit
Admission: RE | Admit: 2020-06-17 | Discharge: 2020-06-17 | Disposition: A | Discharge status: Home / Self Care | Payer: Medicare Other | Source: Ambulatory Visit | Attending: Radiation Oncology | Admitting: Radiation Oncology

## 2020-06-17 ENCOUNTER — Other Ambulatory Visit
Admission: RE | Admit: 2020-06-17 | Discharge: 2020-06-17 | Disposition: A | Discharge status: Home / Self Care | Payer: Medicare Other | Source: Ambulatory Visit | Attending: General Surgery | Admitting: General Surgery

## 2020-06-17 ENCOUNTER — Inpatient Hospital Stay: Payer: Medicare Other

## 2020-06-17 ENCOUNTER — Encounter
Admission: RE | Admit: 2020-06-17 | Discharge: 2020-06-17 | Disposition: A | Discharge status: Home / Self Care | Payer: Medicare Other | Source: Ambulatory Visit | Attending: General Surgery | Admitting: General Surgery

## 2020-06-17 ENCOUNTER — Other Ambulatory Visit: Payer: Self-pay

## 2020-06-17 ENCOUNTER — Inpatient Hospital Stay: Payer: Medicare Other | Admitting: Nurse Practitioner

## 2020-06-17 DIAGNOSIS — C210 Malignant neoplasm of anus, unspecified: Secondary | ICD-10-CM | POA: Diagnosis present

## 2020-06-17 DIAGNOSIS — Z20822 Contact with and (suspected) exposure to covid-19: Secondary | ICD-10-CM | POA: Diagnosis not present

## 2020-06-17 LAB — GLUCOSE, CAPILLARY: Glucose-Capillary: 127 mg/dL — ABNORMAL HIGH (ref 70–99)

## 2020-06-17 LAB — SARS CORONAVIRUS 2 (TAT 6-24 HRS): SARS Coronavirus 2: NEGATIVE

## 2020-06-17 MED ORDER — FLUDEOXYGLUCOSE F - 18 (FDG) INJECTION
10.3000 | Freq: Once | INTRAVENOUS | Status: AC | PRN
  Administered 2020-06-17: 10.86 via INTRAVENOUS

## 2020-06-18 ENCOUNTER — Inpatient Hospital Stay (HOSPITAL_BASED_OUTPATIENT_CLINIC_OR_DEPARTMENT_OTHER): Payer: Medicare Other | Admitting: Internal Medicine

## 2020-06-18 ENCOUNTER — Ambulatory Visit
Admission: RE | Admit: 2020-06-18 | Discharge: 2020-06-18 | Disposition: A | Discharge status: Home / Self Care | Payer: Medicare Other | Source: Ambulatory Visit | Attending: Radiation Oncology | Admitting: Radiation Oncology

## 2020-06-18 VITALS — BP 148/61 | HR 73 | Temp 97.6°F | Resp 18 | Wt 199.0 lb

## 2020-06-18 DIAGNOSIS — C210 Malignant neoplasm of anus, unspecified: Secondary | ICD-10-CM

## 2020-06-18 MED ORDER — PROCHLORPERAZINE MALEATE 10 MG PO TABS: 10.0000 mg | ORAL_TABLET | Freq: Four times a day (QID) | ORAL | 1 refills | Status: DC | PRN

## 2020-06-18 MED ORDER — ONDANSETRON HCL 8 MG PO TABS: ORAL_TABLET | ORAL | 1 refills | Status: DC

## 2020-06-18 MED ORDER — LIDOCAINE-PRILOCAINE 2.5-2.5 % EX CREA: 1.0000 "application " | TOPICAL_CREAM | CUTANEOUS | 2 refills | Status: DC | PRN
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
## 2020-06-18 NOTE — Assessment & Plan Note (Addendum)
#   Anal cancer/verge SCC- stage II [T2N0M0]; Oct 19th 2021- PET scan-uptake noted in the anal canal region; otherwise no regional or distant metastatic disease noted.  #Recommend definitive concurrent chemoradiation-5-FU mitomycin day 1 and day 29.  Recommend same dose 5-FU over 7 days; for logistical reasons.  Again reviewed the potential side effects of chemotherapy including but not limited to nausea vomiting diarrhea low blood counts.  Sent prescription for antiemetics/EMLA cream to the pharmacy.  #Patient wants to move out her follow-up/started treatment to November 8th.  I have asked the patient to follow-up with Dr. Donette Larry office regarding changing schedule.  # DISPOSITION: # follow up on 11/8th; MD; cbc/cmp; FU pump-Mitomycin; pump OFF on 15yj  [-coordinate with radiation appt]-Dr.B  # I reviewed the blood work- with the patient in detail; also reviewed the imaging independently [as summarized above]; and with the patient in detail.

## 2020-06-19 ENCOUNTER — Telehealth: Payer: Self-pay

## 2020-06-19 ENCOUNTER — Other Ambulatory Visit: Payer: Medicare Other

## 2020-06-19 ENCOUNTER — Encounter
Admission: RE | Admit: 2020-06-19 | Discharge: 2020-06-19 | Disposition: A | Discharge status: Home / Self Care | Payer: Medicare Other | Source: Ambulatory Visit | Attending: General Surgery | Admitting: General Surgery

## 2020-06-19 HISTORY — DX: Personal history of other diseases of the digestive system: Z87.19

## 2020-06-19 HISTORY — DX: Essential (primary) hypertension: I10

## 2020-06-19 HISTORY — DX: Anemia, unspecified: D64.9

## 2020-06-19 MED ORDER — ORAL CARE MOUTH RINSE: 15.0000 mL | Freq: Once | OROMUCOSAL | Status: AC

## 2020-06-19 MED ORDER — CEFAZOLIN SODIUM-DEXTROSE 2-4 GM/100ML-% IV SOLN
2.0000 g | INTRAVENOUS | Status: AC
  Administered 2020-06-20: 2 g via INTRAVENOUS

## 2020-06-19 MED ORDER — CHLORHEXIDINE GLUCONATE 0.12 % MT SOLN: 15.0000 mL | Freq: Once | OROMUCOSAL | Status: AC

## 2020-06-19 MED ORDER — FAMOTIDINE 20 MG PO TABS: 20.0000 mg | ORAL_TABLET | Freq: Once | ORAL | Status: DC

## 2020-06-19 MED ORDER — LACTATED RINGERS IV SOLN: INTRAVENOUS | Status: DC

## 2020-06-19 NOTE — Patient Instructions (Signed)
Your procedure is scheduled on:06-20-20 FRIDAY Report to Day Surgery on the 2nd floor of the [facility]. To find out your arrival time, please call [phone] between 1PM - 3PM on:06-19-20 THURSDAY  REMEMBER: Instructions that are not followed completely may result in serious medical risk, up to and including death; or upon the discretion of your surgeon and anesthesiologist your surgery may need to be rescheduled.  Do not eat food after midnight the night before surgery.  No gum chewing, lozengers or hard candies.  You may however, drink CLEAR liquids up to 2 hours before you are scheduled to arrive for your surgery. Do not drink anything within 2 hours of your scheduled arrival time.  Clear liquids include: - water  - apple juice without pulp - gatorade (not RED, PURPLE, OR BLUE) - black coffee or tea (Do NOT add milk or creamers to the coffee or tea) Do NOT drink anything that is not on this list.  TAKE THESE MEDICATIONS THE MORNING OF SURGERY WITH A SIP OF WATER: -SYNTHROID -YOU MAY TYLENOL IF NEEDED  One week prior to surgery: Stop Anti-inflammatories (NSAIDS) such as Advil, Aleve, Ibuprofen, Motrin, Naproxen, Naprosyn and Aspirin based products such as Excedrin, Goodys Powder, BC Powder-OK TO TAKE TYLENOL IF NEEDED  Stop ANY OVER THE COUNTER supplements until after surgery-STOP TURMERIC NOW-YOU MAY RESUME AFTER SURGERY   No Alcohol for 24 hours before or after surgery.  No Smoking including e-cigarettes for 24 hours prior to surgery.  No chewable tobacco products for at least 6 hours prior to surgery.  No nicotine patches on the day of surgery.  Do not use any "recreational" drugs for at least a week prior to your surgery.  Please be advised that the combination of cocaine and anesthesia may have negative outcomes, up to and including death. If you test positive for cocaine, your surgery will be cancelled.  On the morning of surgery brush your teeth with toothpaste  and water, you may rinse your mouth with mouthwash if you wish. Do not swallow any toothpaste or mouthwash.  Do not wear jewelry, make-up, hairpins, clips or nail polish.  Do not wear lotions, powders, or perfumes.   Do not shave 48 hours prior to surgery.   Contact lenses, hearing aids and dentures may not be worn into surgery.  Do not bring valuables to the hospital. [facility] is not responsible for any missing/lost belongings or valuables.   Notify your doctor if there is any change in your medical condition (cold, fever, infection).  Wear comfortable clothing (specific to your surgery type) to the hospital.  Plan for stool softeners for home use; pain medications have a tendency to cause constipation. You can also help prevent constipation by eating foods high in fiber such as fruits and vegetables and drinking plenty of fluids as your diet allows.  After surgery, you can help prevent lung complications by doing breathing exercises.  Take deep breaths and cough every 1-2 hours. Your doctor may order a device called an Incentive Spirometer to help you take deep breaths. When coughing or sneezing, hold a pillow firmly against your incision with both hands. This is called "splinting." Doing this helps protect your incision. It also decreases belly discomfort.  If you are being admitted to the hospital overnight, leave your suitcase in the car. After surgery it may be brought to your room.  If you are being discharged the day of surgery, you will not be allowed to drive home. You will  need a responsible adult (18 years or older) to drive you home and stay with you that night.   If you are taking public transportation, you will need to have a responsible adult (18 years or older) with you. Please confirm with your physician that it is acceptable to use public transportation.   Please call the [facility] at [phone] if you have any questions about these  instructions.  Visitation Policy:  Patients undergoing a surgery or procedure may have one family member or support person with them as long as that person is not COVID-19 positive or experiencing its symptoms.  That person may remain in the waiting area during the procedure.  Inpatient Visitation Update:   In an effort to ensure the safety of our team members and our patients, we are implementing a change to our visitation policy:  Effective Monday, Aug. 9, at 7 a.m., inpatients will be allowed one support person.  o The support person may change daily.  o The support person must pass our screening, gel in and out, and wear a mask at all times, including in the patient's room.  o Patients must also wear a mask when staff or their support person are in the room.  o Masking is required regardless of vaccination status.  Systemwide, no visitors 17 or younger.

## 2020-06-19 NOTE — Telephone Encounter (Signed)
PA for EMLA cream initiated. PA approved.   KEY: [id number]

## 2020-06-20 ENCOUNTER — Encounter
Admission: RE | Disposition: A | Discharge status: Home / Self Care | Payer: Self-pay | Source: Home / Self Care | Attending: General Surgery

## 2020-06-20 ENCOUNTER — Ambulatory Visit
Admission: RE | Admit: 2020-06-20 | Discharge: 2020-06-20 | Disposition: A | Discharge status: Home / Self Care | Payer: Medicare Other | Attending: General Surgery | Admitting: General Surgery

## 2020-06-20 ENCOUNTER — Encounter: Payer: Self-pay | Admitting: General Surgery

## 2020-06-20 ENCOUNTER — Ambulatory Visit: Payer: Medicare Other | Admitting: Anesthesiology

## 2020-06-20 ENCOUNTER — Ambulatory Visit: Payer: Medicare Other

## 2020-06-20 DIAGNOSIS — E039 Hypothyroidism, unspecified: Secondary | ICD-10-CM | POA: Insufficient documentation

## 2020-06-20 DIAGNOSIS — Z95828 Presence of other vascular implants and grafts: Secondary | ICD-10-CM

## 2020-06-20 DIAGNOSIS — C210 Malignant neoplasm of anus, unspecified: Secondary | ICD-10-CM | POA: Diagnosis not present

## 2020-06-20 DIAGNOSIS — Z885 Allergy status to narcotic agent status: Secondary | ICD-10-CM | POA: Insufficient documentation

## 2020-06-20 DIAGNOSIS — Z79899 Other long term (current) drug therapy: Secondary | ICD-10-CM | POA: Insufficient documentation

## 2020-06-20 DIAGNOSIS — I10 Essential (primary) hypertension: Secondary | ICD-10-CM | POA: Diagnosis not present

## 2020-06-20 DIAGNOSIS — Z79890 Hormone replacement therapy (postmenopausal): Secondary | ICD-10-CM | POA: Insufficient documentation

## 2020-06-20 DIAGNOSIS — F419 Anxiety disorder, unspecified: Secondary | ICD-10-CM | POA: Insufficient documentation

## 2020-06-20 HISTORY — PX: PORTACATH PLACEMENT: SHX2246

## 2020-06-20 SURGERY — INSERTION, TUNNELED CENTRAL VENOUS DEVICE, WITH PORT
Anesthesia: General | Laterality: Left

## 2020-06-20 MED ORDER — FENTANYL CITRATE (PF) 100 MCG/2ML IJ SOLN: 25.0000 ug | INTRAMUSCULAR | Status: DC | PRN

## 2020-06-20 MED ORDER — LIDOCAINE HCL 1 % IJ SOLN
INTRAMUSCULAR | Status: DC | PRN
  Administered 2020-06-20: 20 mL

## 2020-06-20 MED ORDER — PROPOFOL 500 MG/50ML IV EMUL
INTRAVENOUS | Status: AC
  Filled 2020-06-20: qty 50

## 2020-06-20 MED ORDER — ONDANSETRON HCL 4 MG/2ML IJ SOLN
INTRAMUSCULAR | Status: DC | PRN
  Administered 2020-06-20: 4 mg via INTRAVENOUS

## 2020-06-20 MED ORDER — ACETAMINOPHEN 10 MG/ML IV SOLN
INTRAVENOUS | Status: DC | PRN
  Administered 2020-06-20: 1000 mg via INTRAVENOUS

## 2020-06-20 MED ORDER — FENTANYL CITRATE (PF) 100 MCG/2ML IJ SOLN
INTRAMUSCULAR | Status: AC
  Filled 2020-06-20: qty 2

## 2020-06-20 MED ORDER — CHLORHEXIDINE GLUCONATE 0.12 % MT SOLN
OROMUCOSAL | Status: AC
  Administered 2020-06-20: 15 mL via OROMUCOSAL
  Filled 2020-06-20: qty 15

## 2020-06-20 MED ORDER — CEFAZOLIN SODIUM-DEXTROSE 2-4 GM/100ML-% IV SOLN
INTRAVENOUS | Status: AC
  Filled 2020-06-20: qty 100

## 2020-06-20 MED ORDER — LIDOCAINE HCL (PF) 2 % IJ SOLN
INTRAMUSCULAR | Status: AC
  Filled 2020-06-20: qty 5

## 2020-06-20 MED ORDER — ONDANSETRON HCL 4 MG/2ML IJ SOLN: 4.0000 mg | Freq: Once | INTRAMUSCULAR | Status: DC | PRN

## 2020-06-20 MED ORDER — LIDOCAINE HCL (CARDIAC) PF 100 MG/5ML IV SOSY
PREFILLED_SYRINGE | INTRAVENOUS | Status: DC | PRN
  Administered 2020-06-20: 60 mg via INTRAVENOUS

## 2020-06-20 MED ORDER — ONDANSETRON HCL 4 MG/2ML IJ SOLN
INTRAMUSCULAR | Status: AC
  Filled 2020-06-20: qty 2

## 2020-06-20 MED ORDER — ACETAMINOPHEN 10 MG/ML IV SOLN
INTRAVENOUS | Status: AC
  Filled 2020-06-20: qty 100

## 2020-06-20 MED ORDER — PHENYLEPHRINE HCL (PRESSORS) 10 MG/ML IV SOLN
INTRAVENOUS | Status: DC | PRN
  Administered 2020-06-20 (×2): 100 ug via INTRAVENOUS

## 2020-06-20 MED ORDER — FENTANYL CITRATE (PF) 100 MCG/2ML IJ SOLN
INTRAMUSCULAR | Status: DC | PRN
  Administered 2020-06-20 (×3): 25 ug via INTRAVENOUS

## 2020-06-20 MED ORDER — PROPOFOL 500 MG/50ML IV EMUL
INTRAVENOUS | Status: DC | PRN
  Administered 2020-06-20: 75 ug/kg/min via INTRAVENOUS

## 2020-06-20 MED ORDER — FAMOTIDINE 20 MG PO TABS
ORAL_TABLET | ORAL | Status: AC
  Filled 2020-06-20: qty 1

## 2020-06-20 SURGICAL SUPPLY — 33 items
APL PRP STRL LF DISP 70% ISPRP (MISCELLANEOUS) ×1
BAG DECANTER FOR FLEXI CONT (MISCELLANEOUS) ×3 IMPLANT
BLADE SURG 15 STRL SS SAFETY (BLADE) ×3 IMPLANT
CHLORAPREP W/TINT 26 (MISCELLANEOUS) ×3 IMPLANT
CLOSURE WOUND 1/2 X4 (GAUZE/BANDAGES/DRESSINGS) ×1
COVER LIGHT HANDLE STERIS (MISCELLANEOUS) ×6 IMPLANT
COVER WAND RF STERILE (DRAPES) ×3 IMPLANT
DECANTER SPIKE VIAL GLASS SM (MISCELLANEOUS) ×6 IMPLANT
DRAPE C-ARM XRAY 36X54 (DRAPES) ×3 IMPLANT
DRAPE LAPAROTOMY TRNSV 106X77 (MISCELLANEOUS) ×3 IMPLANT
DRSG TEGADERM 2-3/8X2-3/4 SM (GAUZE/BANDAGES/DRESSINGS) ×3 IMPLANT
DRSG TEGADERM 4X4.75 (GAUZE/BANDAGES/DRESSINGS) ×3 IMPLANT
DRSG TELFA 4X3 1S NADH ST (GAUZE/BANDAGES/DRESSINGS) ×3 IMPLANT
ELECT REM PT RETURN 9FT ADLT (ELECTROSURGICAL) ×3
ELECTRODE REM PT RTRN 9FT ADLT (ELECTROSURGICAL) ×1 IMPLANT
GLOVE BIO SURGEON STRL SZ7.5 (GLOVE) ×3 IMPLANT
GLOVE INDICATOR 8.0 STRL GRN (GLOVE) ×3 IMPLANT
GOWN STRL REUS W/ TWL LRG LVL3 (GOWN DISPOSABLE) ×2 IMPLANT
GOWN STRL REUS W/TWL LRG LVL3 (GOWN DISPOSABLE) ×6
IV NS 500ML (IV SOLUTION) ×3
IV NS 500ML BAXH (IV SOLUTION) ×1 IMPLANT
KIT PORT POWER 8FR ISP CVUE (Port) ×3 IMPLANT
KIT TURNOVER KIT A (KITS) ×3 IMPLANT
LABEL OR SOLS (LABEL) ×3 IMPLANT
PACK PORT-A-CATH (MISCELLANEOUS) ×3 IMPLANT
STRIP CLOSURE SKIN 1/2X4 (GAUZE/BANDAGES/DRESSINGS) ×2 IMPLANT
SUT PROLENE 3 0 SH DA (SUTURE) ×3 IMPLANT
SUT VIC AB 3-0 SH 27 (SUTURE) ×3
SUT VIC AB 3-0 SH 27X BRD (SUTURE) ×1 IMPLANT
SUT VIC AB 4-0 FS2 27 (SUTURE) ×3 IMPLANT
SWABSTK COMLB BENZOIN TINCTURE (MISCELLANEOUS) ×3 IMPLANT
SYR 10ML LL (SYRINGE) ×3 IMPLANT
SYR 10ML SLIP (SYRINGE) ×3 IMPLANT

## 2020-06-20 NOTE — Discharge Instructions (Signed)
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
## 2020-06-20 NOTE — Anesthesia Preprocedure Evaluation (Signed)
Anesthesia Evaluation  Patient identified by MRN, date of birth, ID band Patient awake    Reviewed: Allergy & Precautions, NPO status , Patient's Chart, lab work & pertinent test results  History of Anesthesia Complications Negative for: history of anesthetic complications  Airway Mallampati: III       Dental   Pulmonary neg sleep apnea, neg COPD, Not current smoker,           Cardiovascular hypertension, Pt. on medications (-) Past MI and (-) CHF (-) dysrhythmias (-) Valvular Problems/Murmurs     Neuro/Psych neg Seizures Anxiety    GI/Hepatic Neg liver ROS, hiatal hernia, neg GERD  ,  Endo/Other  neg diabetesHypothyroidism   Renal/GU negative Renal ROS     Musculoskeletal   Abdominal   Peds  Hematology  (+) anemia ,   Anesthesia Other Findings   Reproductive/Obstetrics                             Anesthesia Physical Anesthesia Plan  ASA: II  Anesthesia Plan: General   Post-op Pain Management:    Induction: Intravenous  PONV Risk Score and Plan: 3 and Propofol infusion and TIVA  Airway Management Planned: Nasal Cannula  Additional Equipment:   Intra-op Plan:   Post-operative Plan:   Informed Consent: I have reviewed the patients History and Physical, chart, labs and discussed the procedure including the risks, benefits and alternatives for the proposed anesthesia with the patient or authorized representative who has indicated his/her understanding and acceptance.       Plan Discussed with:   Anesthesia Plan Comments:         Anesthesia Quick Evaluation

## 2020-06-20 NOTE — Transfer of Care (Signed)
Immediate Anesthesia Transfer of Care Note  Patient: [name]  Procedure(s) Performed: INSERTION PORT-A-CATH (Left )  Patient Location: PACU  Anesthesia Type:MAC  Level of Consciousness: awake, alert  and oriented  Airway & Oxygen Therapy: Patient Spontanous Breathing and Patient connected to face mask oxygen  Post-op Assessment: Report given to RN and Post -op Vital signs reviewed and stable  Post vital signs: stable  Last Vitals:  Vitals Value Taken Time  BP    Temp    Pulse    Resp    SpO2      Last Pain:  Vitals:   06/20/20 0939  TempSrc: Tympanic  PainSc: 0-No pain         Complications: No complications documented.

## 2020-06-20 NOTE — Progress Notes (Signed)
Tumor Board Documentation  [name] was presented by Dr Rogue Bussing at our Tumor Board on 06/19/2020, which included representatives from medical oncology, radiation oncology, surgical oncology, internal medicine, navigation, pathology, radiology, surgical, pharmacy, genetics, research, palliative care, pulmonology.  [name] currently presents as a new patient, for [place], for new positive pathology with history of the following treatments: surgical intervention(s).  Additionally, we reviewed previous medical and familial history, history of present illness, and recent lab results along with all available histopathologic and imaging studies. The tumor board considered available treatment options and made the following recommendations: Surgery    The following procedures/referrals were also placed: No orders of the defined types were placed in this encounter.   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  Tumor board is a meeting of clinicians from various specialty areas who evaluate and discuss patients for whom a multidisciplinary approach is being considered. Final determinations in the plan of care are those of the provider(s). The responsibility for follow up of recommendations given during tumor board is that of the provider.   Today's extended care, comprehensive team conference, [name] was not present for the discussion and was not examined.   Multidisciplinary Tumor Board is a multidisciplinary case peer review process.  Decisions discussed in the Multidisciplinary Tumor Board reflect the opinions of the specialists present at the conference without having examined the patient.  Ultimately, treatment and diagnostic decisions rest with the primary provider(s) and the patient.

## 2020-06-20 NOTE — Anesthesia Postprocedure Evaluation (Signed)
Anesthesia Post Note  Patient: [name]  Procedure(s) Performed: INSERTION PORT-A-CATH (Left )  Patient location during evaluation: PACU Anesthesia Type: General Level of consciousness: awake and alert Pain management: pain level controlled Vital Signs Assessment: post-procedure vital signs reviewed and stable Respiratory status: spontaneous breathing and respiratory function stable Cardiovascular status: stable Anesthetic complications: no   No complications documented.   Last Vitals:  Vitals:   06/20/20 1200 06/20/20 1222  BP: 140/65 (!) 149/65  Pulse: 66 74  Resp: 14 16  Temp: 36.6 C (!) 36.2 C  SpO2: 99% 100%    Last Pain:  Vitals:   06/20/20 1222  TempSrc: Temporal  PainSc: 4                  Shruti Arrey K

## 2020-06-20 NOTE — Op Note (Signed)
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  Postoperative diagnosis: Same.  Operative procedure: Left subclavian PowerPort placement with ultrasound and fluoroscopic guidance.  Operating surgeon: Hervey Ard, MD.  Anesthesia: Attended local, 20 cc 1% plain Xylocaine.  Estimated blood loss: Minimal.  Clinical note: This 26 woman was recently diagnosed with squamous cell carcinoma of the anus and is considered a candidate for concomitant chemoradiation.  Central venous access was requested by her treating oncologist.  Operative note: The patient received Ancef prior to the procedure.  The left chest and neck was cleansed with ChloraPrep and draped.  She was placed in the Trendelenburg position.  Ultrasound showed the subclavian vein to be fairly deep.  This was cannulated under ultrasound guidance and ultrasound image obtained for permanent record.  The guidewire was passed followed by the dilator.  Under fluoroscopy the catheter tip was placed into the junction of the SVC and right atrium.  It was then tunneled to a pocket on the left anterior chest wall.  The skin in this location had been incised sharply and the pocket created with electrocautery.  The port was anchored to the deep tissue with 3-0 Prolene sutures x2.  The adipose layer was closed with a running 3-0 Vicryl suture.  The skin closed with a running 4-0 Vicryl subcuticular suture.  Benzoin, Steri-Strips, Telfa and Tegaderm dressing were applied.  In the PACU a erect portable chest x-ray showed the catheter tip as described above and no evidence of pneumothorax.

## 2020-06-23 ENCOUNTER — Encounter: Payer: Self-pay | Admitting: General Surgery

## 2020-06-24 DIAGNOSIS — C210 Malignant neoplasm of anus, unspecified: Secondary | ICD-10-CM | POA: Diagnosis not present

## 2020-06-30 ENCOUNTER — Ambulatory Visit: Payer: Medicare Other

## 2020-07-01 ENCOUNTER — Ambulatory Visit: Payer: Medicare Other

## 2020-07-02 ENCOUNTER — Ambulatory Visit: Payer: Medicare Other

## 2020-07-03 ENCOUNTER — Ambulatory Visit: Admission: RE | Admit: 2020-07-03 | Payer: Medicare Other | Source: Ambulatory Visit

## 2020-07-03 ENCOUNTER — Ambulatory Visit: Payer: Medicare Other

## 2020-07-04 ENCOUNTER — Ambulatory Visit: Payer: Medicare Other

## 2020-07-07 ENCOUNTER — Inpatient Hospital Stay (HOSPITAL_BASED_OUTPATIENT_CLINIC_OR_DEPARTMENT_OTHER): Payer: Medicare Other | Admitting: Internal Medicine

## 2020-07-07 ENCOUNTER — Other Ambulatory Visit: Payer: Self-pay

## 2020-07-07 ENCOUNTER — Inpatient Hospital Stay: Payer: Medicare Other

## 2020-07-07 ENCOUNTER — Inpatient Hospital Stay: Payer: Medicare Other | Attending: Internal Medicine

## 2020-07-07 ENCOUNTER — Ambulatory Visit
Admission: RE | Admit: 2020-07-07 | Discharge: 2020-07-07 | Disposition: A | Discharge status: Home / Self Care | Payer: Medicare Other | Source: Ambulatory Visit | Attending: Radiation Oncology | Admitting: Radiation Oncology

## 2020-07-07 DIAGNOSIS — Z5111 Encounter for antineoplastic chemotherapy: Secondary | ICD-10-CM | POA: Insufficient documentation

## 2020-07-07 DIAGNOSIS — C210 Malignant neoplasm of anus, unspecified: Secondary | ICD-10-CM

## 2020-07-07 DIAGNOSIS — C211 Malignant neoplasm of anal canal: Secondary | ICD-10-CM | POA: Diagnosis present

## 2020-07-07 DIAGNOSIS — K1231 Oral mucositis (ulcerative) due to antineoplastic therapy: Secondary | ICD-10-CM | POA: Insufficient documentation

## 2020-07-07 DIAGNOSIS — T451X5A Adverse effect of antineoplastic and immunosuppressive drugs, initial encounter: Secondary | ICD-10-CM | POA: Diagnosis not present

## 2020-07-07 LAB — CBC WITH DIFFERENTIAL/PLATELET
Abs Immature Granulocytes: 0.03 10*3/uL (ref 0.00–0.07)
Basophils Absolute: 0.1 10*3/uL (ref 0.0–0.1)
Basophils Relative: 1 %
Eosinophils Absolute: 0.1 10*3/uL (ref 0.0–0.5)
Eosinophils Relative: 1 %
HCT: 36 % (ref 36.0–46.0)
Hemoglobin: 12.4 g/dL (ref 12.0–15.0)
Immature Granulocytes: 0 %
Lymphocytes Relative: 26 %
Lymphs Abs: 2 10*3/uL (ref 0.7–4.0)
MCH: 32.1 pg (ref 26.0–34.0)
MCHC: 34.4 g/dL (ref 30.0–36.0)
MCV: 93.3 fL (ref 80.0–100.0)
Monocytes Absolute: 0.7 10*3/uL (ref 0.1–1.0)
Monocytes Relative: 9 %
Neutro Abs: 4.9 10*3/uL (ref 1.7–7.7)
Neutrophils Relative %: 63 %
Platelets: 322 10*3/uL (ref 150–400)
RBC: 3.86 MIL/uL — ABNORMAL LOW (ref 3.87–5.11)
RDW: 12.3 % (ref 11.5–15.5)
WBC: 7.8 10*3/uL (ref 4.0–10.5)
nRBC: 0 % (ref 0.0–0.2)

## 2020-07-07 LAB — COMPREHENSIVE METABOLIC PANEL
ALT: 22 U/L (ref 0–44)
AST: 29 U/L (ref 15–41)
Albumin: 4 g/dL (ref 3.5–5.0)
Alkaline Phosphatase: 67 U/L (ref 38–126)
Anion gap: 9 (ref 5–15)
BUN: 15 mg/dL (ref 8–23)
CO2: 26 mmol/L (ref 22–32)
Calcium: 9.4 mg/dL (ref 8.9–10.3)
Chloride: 102 mmol/L (ref 98–111)
Creatinine, Ser: 0.83 mg/dL (ref 0.44–1.00)
GFR, Estimated: >60 mL/min (ref >60)
Glucose, Bld: 97 mg/dL (ref 70–99)
Potassium: 3.7 mmol/L (ref 3.5–5.1)
Sodium: 137 mmol/L (ref 135–145)
Total Bilirubin: 0.6 mg/dL (ref 0.3–1.2)
Total Protein: 7.5 g/dL (ref 6.5–8.1)

## 2020-07-07 MED ORDER — MITOMYCIN CHEMO IV INJECTION 40 MG
10.0000 mg/m2 | Freq: Once | INTRAVENOUS | Status: AC
  Administered 2020-07-07: 20 mg via INTRAVENOUS
  Filled 2020-07-07: qty 40

## 2020-07-07 MED ORDER — PROCHLORPERAZINE MALEATE 10 MG PO TABS
10.0000 mg | ORAL_TABLET | Freq: Once | ORAL | Status: AC
  Administered 2020-07-07: 10 mg via ORAL
  Filled 2020-07-07: qty 1

## 2020-07-07 MED ORDER — SODIUM CHLORIDE 0.9% FLUSH
10.0000 mL | INTRAVENOUS | Status: DC | PRN
  Administered 2020-07-07: 10 mL
  Filled 2020-07-07: qty 10

## 2020-07-07 MED ORDER — SODIUM CHLORIDE 0.9 % IV SOLN
Freq: Once | INTRAVENOUS | Status: AC
  Filled 2020-07-07: qty 250

## 2020-07-07 MED ORDER — SODIUM CHLORIDE 0.9 % IV SOLN
1000.0000 mg/m2/d | INTRAVENOUS | Status: DC
  Administered 2020-07-07: 8050 mg via INTRAVENOUS
  Filled 2020-07-07: qty 161
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
## 2020-07-07 NOTE — Assessment & Plan Note (Addendum)
#   Anal cancer/verge SCC- stage II [T2N0M0]; Oct 19th 2021- PET scan-uptake noted in the anal canal region; otherwise no regional or distant metastatic disease noted. Plan definitive concurrent chemoradiation-5-FU mitomycin day 1 and day 29.    #Proceed with 5-FU mitomycin-day 1 today. Labs today reviewed;  acceptable for treatment today.   Again reviewed the potential side effects of chemotherapy including but not limited to nausea vomiting diarrhea low blood counts.  Recommend the patient has Imodium at home.  Patient verbalized that she picked up her antiemetics from the pharmacy.  To call us if needed  #Chronic joint pains-recommend continue taking Tylenol as needed.  #Hypertension hypo thyroidism-recommend patient takes her regular medications.  # DISPOSITION: # proceed with chemo today; Pump OFF on 11/12; DC 11/15 pump off appt  # follow up in 2 weeks; MD; labs- cbc/cmp; Possible IVFs- over 1 hour; [coordinate with radiation appt]- Dr.B

## 2020-07-07 NOTE — Addendum Note (Signed)
Addended by: Delice Bison E on: 07/07/2020 03:27 PM   Modules accepted: Orders

## 2020-07-07 NOTE — Patient Instructions (Signed)
If you have any questions or concerns, please [phone] (option #[phone]) Triage.

## 2020-07-08 ENCOUNTER — Ambulatory Visit
Admission: RE | Admit: 2020-07-08 | Discharge: 2020-07-08 | Disposition: A | Discharge status: Home / Self Care | Payer: Medicare Other | Source: Ambulatory Visit | Attending: Radiation Oncology | Admitting: Radiation Oncology

## 2020-07-08 DIAGNOSIS — C210 Malignant neoplasm of anus, unspecified: Secondary | ICD-10-CM | POA: Diagnosis not present

## 2020-07-09 ENCOUNTER — Ambulatory Visit
Admission: RE | Admit: 2020-07-09 | Discharge: 2020-07-09 | Disposition: A | Discharge status: Home / Self Care | Payer: Medicare Other | Source: Ambulatory Visit | Attending: Radiation Oncology | Admitting: Radiation Oncology

## 2020-07-09 DIAGNOSIS — C210 Malignant neoplasm of anus, unspecified: Secondary | ICD-10-CM | POA: Diagnosis not present

## 2020-07-10 ENCOUNTER — Ambulatory Visit
Admission: RE | Admit: 2020-07-10 | Discharge: 2020-07-10 | Disposition: A | Discharge status: Home / Self Care | Payer: Medicare Other | Source: Ambulatory Visit | Attending: Radiation Oncology | Admitting: Radiation Oncology

## 2020-07-10 DIAGNOSIS — C210 Malignant neoplasm of anus, unspecified: Secondary | ICD-10-CM | POA: Diagnosis not present

## 2020-07-11 ENCOUNTER — Other Ambulatory Visit: Payer: Self-pay

## 2020-07-11 ENCOUNTER — Ambulatory Visit
Admission: RE | Admit: 2020-07-11 | Discharge: 2020-07-11 | Disposition: A | Discharge status: Home / Self Care | Payer: Medicare Other | Source: Ambulatory Visit | Attending: Radiation Oncology | Admitting: Radiation Oncology

## 2020-07-11 ENCOUNTER — Inpatient Hospital Stay: Payer: Medicare Other

## 2020-07-11 DIAGNOSIS — C210 Malignant neoplasm of anus, unspecified: Secondary | ICD-10-CM | POA: Diagnosis not present

## 2020-07-11 DIAGNOSIS — Z5111 Encounter for antineoplastic chemotherapy: Secondary | ICD-10-CM | POA: Diagnosis not present

## 2020-07-11 MED ORDER — SODIUM CHLORIDE 0.9% FLUSH
10.0000 mL | INTRAVENOUS | Status: DC | PRN
  Administered 2020-07-11: 10 mL
  Filled 2020-07-11: qty 10

## 2020-07-11 MED ORDER — HEPARIN SOD (PORK) LOCK FLUSH 100 UNIT/ML IV SOLN
500.0000 [IU] | Freq: Once | INTRAVENOUS | Status: AC | PRN
  Administered 2020-07-11: 500 [IU]
  Filled 2020-07-11: qty 5

## 2020-07-11 NOTE — Progress Notes (Signed)
Patient for pump d/c. Stable at discharge

## 2020-07-14 ENCOUNTER — Ambulatory Visit
Admission: RE | Admit: 2020-07-14 | Discharge: 2020-07-14 | Disposition: A | Discharge status: Home / Self Care | Payer: Medicare Other | Source: Ambulatory Visit | Attending: Radiation Oncology | Admitting: Radiation Oncology

## 2020-07-14 DIAGNOSIS — C210 Malignant neoplasm of anus, unspecified: Secondary | ICD-10-CM | POA: Diagnosis not present

## 2020-07-15 ENCOUNTER — Other Ambulatory Visit: Payer: Self-pay

## 2020-07-15 ENCOUNTER — Inpatient Hospital Stay (HOSPITAL_BASED_OUTPATIENT_CLINIC_OR_DEPARTMENT_OTHER): Payer: Medicare Other | Admitting: Nurse Practitioner

## 2020-07-15 ENCOUNTER — Inpatient Hospital Stay: Payer: Medicare Other

## 2020-07-15 ENCOUNTER — Ambulatory Visit
Admission: RE | Admit: 2020-07-15 | Discharge: 2020-07-15 | Disposition: A | Discharge status: Home / Self Care | Payer: Medicare Other | Source: Ambulatory Visit | Attending: Radiation Oncology | Admitting: Radiation Oncology

## 2020-07-15 VITALS — BP 153/73 | HR 81 | Temp 98.3°F | Resp 18 | Wt 200.4 lb

## 2020-07-15 VITALS — BP 143/76 | HR 70 | Temp 98.5°F

## 2020-07-15 DIAGNOSIS — C210 Malignant neoplasm of anus, unspecified: Secondary | ICD-10-CM | POA: Diagnosis not present

## 2020-07-15 DIAGNOSIS — K1379 Other lesions of oral mucosa: Secondary | ICD-10-CM

## 2020-07-15 DIAGNOSIS — K1231 Oral mucositis (ulcerative) due to antineoplastic therapy: Secondary | ICD-10-CM | POA: Diagnosis not present

## 2020-07-15 DIAGNOSIS — E860 Dehydration: Secondary | ICD-10-CM

## 2020-07-15 DIAGNOSIS — Z5111 Encounter for antineoplastic chemotherapy: Secondary | ICD-10-CM | POA: Diagnosis not present

## 2020-07-15 MED ORDER — HEPARIN SOD (PORK) LOCK FLUSH 100 UNIT/ML IV SOLN
500.0000 [IU] | Freq: Once | INTRAVENOUS | Status: AC
  Administered 2020-07-15: 500 [IU] via INTRAVENOUS
  Filled 2020-07-15: qty 5

## 2020-07-15 MED ORDER — KETOROLAC TROMETHAMINE 15 MG/ML IJ SOLN: 30.0000 mg | Freq: Once | INTRAMUSCULAR | Status: DC

## 2020-07-15 MED ORDER — SODIUM CHLORIDE 0.9 % IV SOLN
Freq: Once | INTRAVENOUS | Status: AC
  Filled 2020-07-15: qty 250

## 2020-07-15 MED ORDER — HYDROCODONE-ACETAMINOPHEN 5-325 MG PO TABS: 1.0000 | ORAL_TABLET | Freq: Four times a day (QID) | ORAL | 0 refills | Status: DC | PRN

## 2020-07-15 MED ORDER — FLUCONAZOLE 100 MG PO TABS: ORAL_TABLET | ORAL | 0 refills | Status: AC

## 2020-07-15 MED ORDER — SODIUM CHLORIDE 0.9% FLUSH
10.0000 mL | INTRAVENOUS | Status: DC | PRN
  Administered 2020-07-15: 10 mL via INTRAVENOUS
  Filled 2020-07-15: qty 10

## 2020-07-15 NOTE — Progress Notes (Signed)
In [facility] clinic for mucositis. Pt had 1 episode of diarrhea this am. Pt received 1 liter of NS . Faxed prescription for medicated mouthwash to [facility] in [place]. NP to send prescriptions in for hydrocodone and diflucan
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
## 2020-07-15 NOTE — Patient Instructions (Signed)
Oral Mucositis Oral mucositis is a mouth condition that may develop as a result of treatments for cancer. Sores may appear on the lips, gums, tongue, throat, and the top (roof) or bottom (floor) of the mouth. What are the causes? This condition is caused when cancer treatments damage the lining of the mouth. This condition can happen to anyone who is being treated with cancer therapies, including:  Cancer medicines (chemotherapy) or targeted therapy.  Radiation therapy.  Bone marrow transplants and stem cell transplants. Oral mucositis is not caused by infection. However, the sores can become infected after they form. Infection can make oral mucositis worse. What increases the risk? The following factors may make you more likely to develop this condition:  Having cancers that primarily affect the blood, head, or neck.  Receiving radiation therapy alone, or in combination with chemotherapy, to the head and neck region.  Undergoing high dose chemotherapy alone or as part of bone marrow or stem cell transplant. In addition, there are other risks, including:  Having poor oral hygiene, dental problems or oral diseases.  Wearing dentures that do not fit correctly.  Having other medical conditions, such as diabetes, HIV, AIDS, or kidney disease.  Using products that contain nicotine or tobacco, such as cigarettes, chewing tobacco, and e-cigarettes.  Not drinking enough clear fluids.  Drinking alcohol. What are the signs or symptoms? Symptoms of this condition include:  Mouth sores. These sores may bleed.  Color changes inside the mouth. Red, shiny areas may appear.  White patches or pus in the mouth.  Pain in the mouth and throat. This can make it painful to speak and swallow.  Dryness and a burning feeling in the mouth.  Saliva that is thick.  Trouble eating, drinking, and swallowing. This can lead to weight loss. Symptoms of this condition can vary from mild to severe.  Symptoms are usually seen 7-10 days after cancer treatment has started. How is this diagnosed? This condition can be diagnosed with a physical exam. In some cases, lab tests or cultures may be done to check for an associated infection. How is this treated? Treatment depends on the severity of the condition. Oral mucositis often heals on its own. Sometimes, changes in the cancer treatment can help. Treatment may include medicines or therapies, such as:  An antibiotic medicine to fight infection.  Medicine or therapies that help the cells in your mouth heal more quickly.  Chewing on ice before treatment to help prevent mucositis. Medicine may also be given to help control pain. This may include:  Pain relievers that are swished around in the mouth (topical anesthetics). These make the mouth numb to ease the pain.  Mouth rinses.  Prescribed, medicated gels. The gel coats the mouth. This protects nerve endings and lessens the pain.  Pain medicines. Follow these instructions at home: Medicines  Take or apply over-the-counter and prescription medicines only as told by your health care provider.  If you were prescribed an antibiotic medicine, take or apply it as told by your health care provider. Do not stop using the antibiotic even if you start to feel better.  Do not use products that contain benzocaine, including numbing gels, to treat mouth pain in children who are younger than 2 years. These products may cause a rare but serious blood condition. Eating and drinking   Talk to a diet and nutrition specialist (dietitian) about what you should eat and drink if you have mucositis.  Drink high-nutrition and high-calorie shakes or supplements.    Eat bland and soft foods that are easy to eat.  Drink enough fluid to keep your urine pale yellow.  Do not eat foods that are hot, spicy, citrus, or hard to swallow.  Do not drink alcohol.  Try sucking on ice chips or sugar-free frozen pops.  This may help with pain. This also keeps your mouth moist. Lifestyle      Keep your mouth clean and germ-free. To maintain good oral hygiene: ? Brush your teeth carefully with a soft toothbrush at least two times each day. Use a gentle toothpaste. Ask your health care provider to recommend the right toothpaste for you. ? Use a soft sponge (oral swab) to clean your mouth and teeth instead of a toothbrush if mouth sores are severe. ? Floss your teeth every day. ? Have your teeth cleaned regularly as recommended by your dentist. ? Rinse your mouth after every meal or as directed by your health care provider. Do not use mouthwash that contains alcohol. Ask your health care provider for a mouthwash or mouth rinse recommendation.  Do not use any products that contain nicotine or tobacco, such as cigarettes, e-cigarettes, and chewing tobacco. If you need help quitting, ask your health care provider. General instructions  Follow instructions from your health care provider about: ? Cleaning mouth sores. ? Taking out your dentures. ? Changing your diet or finding other ways to get nutrients. This is important if you are losing weight.  If your lips are dry or cracked, apply a water-based moisturizer to your lips as needed.  Keep all follow-up visits as told by your health care provider. This is important. Contact a health care provider if:  You have mouth pain or throat pain.  Your symptoms get worse.  You have new symptoms.  Your pain is not controlled with medicine.  You have trouble speaking. Get help right away if you:  Have a fever.  Cannot swallow solid food or liquids.  Have a lot of bleeding in your mouth.  Develop new, open, or draining sores in your mouth. Summary  Oral mucositis is a mouth condition that may develop as a result of treatments for cancer.  Sores may appear on your lips, gums, tongue, throat, and the top (roof) or bottom (floor) of your mouth.  Cancer  treatments can damage the lining of the mouth, which causes this condition.  Treatment depends on how severe the condition is. It may include medicine or therapies to fight infection, medicine to ease pain, or medicine to help the cells in your mouth heal more quickly. This information is not intended to replace advice given to you by your health care provider. Make sure you discuss any questions you have with your health care provider. Document Revised: 03/26/2019 Document Reviewed: 03/26/2019 Elsevier Patient Education  2020 [employer]  

## 2020-07-15 NOTE — Progress Notes (Signed)
Mouth sores started last couple days. Worsened yesterday. Eating soups and liqs. Hurts to swallow.Hurts up into her ears as well. Recommend Boost or Ensure or carnation instant breakfast. Started diarrhea today. Has been constipated. Took 2 imodium this am.

## 2020-07-16 ENCOUNTER — Ambulatory Visit
Admission: RE | Admit: 2020-07-16 | Discharge: 2020-07-16 | Disposition: A | Discharge status: Home / Self Care | Payer: Medicare Other | Source: Ambulatory Visit | Attending: Radiation Oncology | Admitting: Radiation Oncology

## 2020-07-16 DIAGNOSIS — C210 Malignant neoplasm of anus, unspecified: Secondary | ICD-10-CM | POA: Diagnosis not present

## 2020-07-17 ENCOUNTER — Ambulatory Visit
Admission: RE | Admit: 2020-07-17 | Discharge: 2020-07-17 | Disposition: A | Discharge status: Home / Self Care | Payer: Medicare Other | Source: Ambulatory Visit | Attending: Radiation Oncology | Admitting: Radiation Oncology

## 2020-07-17 DIAGNOSIS — C210 Malignant neoplasm of anus, unspecified: Secondary | ICD-10-CM | POA: Diagnosis not present

## 2020-07-18 ENCOUNTER — Ambulatory Visit
Admission: RE | Admit: 2020-07-18 | Discharge: 2020-07-18 | Disposition: A | Discharge status: Home / Self Care | Payer: Medicare Other | Source: Ambulatory Visit | Attending: Radiation Oncology | Admitting: Radiation Oncology

## 2020-07-18 DIAGNOSIS — C210 Malignant neoplasm of anus, unspecified: Secondary | ICD-10-CM | POA: Diagnosis not present

## 2020-07-21 ENCOUNTER — Inpatient Hospital Stay: Payer: Medicare Other

## 2020-07-21 ENCOUNTER — Telehealth: Payer: Self-pay | Admitting: *Deleted

## 2020-07-21 ENCOUNTER — Encounter: Payer: Self-pay | Admitting: Internal Medicine

## 2020-07-21 ENCOUNTER — Ambulatory Visit
Admission: RE | Admit: 2020-07-21 | Discharge: 2020-07-21 | Disposition: A | Discharge status: Home / Self Care | Payer: Medicare Other | Source: Ambulatory Visit | Attending: Radiation Oncology | Admitting: Radiation Oncology

## 2020-07-21 ENCOUNTER — Inpatient Hospital Stay (HOSPITAL_BASED_OUTPATIENT_CLINIC_OR_DEPARTMENT_OTHER): Payer: Medicare Other | Admitting: Internal Medicine

## 2020-07-21 DIAGNOSIS — C210 Malignant neoplasm of anus, unspecified: Secondary | ICD-10-CM

## 2020-07-21 DIAGNOSIS — Z5111 Encounter for antineoplastic chemotherapy: Secondary | ICD-10-CM | POA: Diagnosis not present

## 2020-07-21 DIAGNOSIS — M542 Cervicalgia: Secondary | ICD-10-CM | POA: Diagnosis not present

## 2020-07-21 DIAGNOSIS — E860 Dehydration: Secondary | ICD-10-CM | POA: Insufficient documentation

## 2020-07-21 LAB — CBC WITH DIFFERENTIAL/PLATELET
Abs Immature Granulocytes: 0.01 10*3/uL (ref 0.00–0.07)
Basophils Absolute: 0 10*3/uL (ref 0.0–0.1)
Basophils Relative: 0 %
Eosinophils Absolute: 0.1 10*3/uL (ref 0.0–0.5)
Eosinophils Relative: 3 %
HCT: 34.5 % — ABNORMAL LOW (ref 36.0–46.0)
Hemoglobin: 12 g/dL (ref 12.0–15.0)
Immature Granulocytes: 0 %
Lymphocytes Relative: 12 %
Lymphs Abs: 0.5 10*3/uL — ABNORMAL LOW (ref 0.7–4.0)
MCH: 32.4 pg (ref 26.0–34.0)
MCHC: 34.8 g/dL (ref 30.0–36.0)
MCV: 93.2 fL (ref 80.0–100.0)
Monocytes Absolute: 0.8 10*3/uL (ref 0.1–1.0)
Monocytes Relative: 18 %
Neutro Abs: 2.9 10*3/uL (ref 1.7–7.7)
Neutrophils Relative %: 67 %
Platelets: 150 10*3/uL (ref 150–400)
RBC: 3.7 MIL/uL — ABNORMAL LOW (ref 3.87–5.11)
RDW: 12.4 % (ref 11.5–15.5)
WBC: 4.3 10*3/uL (ref 4.0–10.5)
nRBC: 0 % (ref 0.0–0.2)

## 2020-07-21 LAB — COMPREHENSIVE METABOLIC PANEL
ALT: 20 U/L (ref 0–44)
AST: 28 U/L (ref 15–41)
Albumin: 4.5 g/dL (ref 3.5–5.0)
Alkaline Phosphatase: 55 U/L (ref 38–126)
Anion gap: 13 (ref 5–15)
BUN: 14 mg/dL (ref 8–23)
CO2: 22 mmol/L (ref 22–32)
Calcium: 9.4 mg/dL (ref 8.9–10.3)
Chloride: 101 mmol/L (ref 98–111)
Creatinine, Ser: 0.99 mg/dL (ref 0.44–1.00)
GFR, Estimated: >60 mL/min (ref >60)
Glucose, Bld: 98 mg/dL (ref 70–99)
Potassium: 3.9 mmol/L (ref 3.5–5.1)
Sodium: 136 mmol/L (ref 135–145)
Total Bilirubin: 0.6 mg/dL (ref 0.3–1.2)
Total Protein: 7.7 g/dL (ref 6.5–8.1)

## 2020-07-21 MED ORDER — SODIUM CHLORIDE 0.9% FLUSH
10.0000 mL | Freq: Once | INTRAVENOUS | Status: AC
  Administered 2020-07-21: 10 mL via INTRAVENOUS
  Filled 2020-07-21: qty 10

## 2020-07-21 MED ORDER — HEPARIN SOD (PORK) LOCK FLUSH 100 UNIT/ML IV SOLN
INTRAVENOUS | Status: AC
  Filled 2020-07-21: qty 5

## 2020-07-21 MED ORDER — DIAZEPAM 5 MG PO TABS
ORAL_TABLET | ORAL | 0 refills | Status: DC
Start: 2020-07-21 — End: 2021-09-07

## 2020-07-21 MED ORDER — DIAZEPAM 5 MG PO TABS: ORAL_TABLET | ORAL | 0 refills | Status: DC

## 2020-07-21 MED ORDER — SODIUM CHLORIDE 0.9 % IV SOLN
Freq: Once | INTRAVENOUS | Status: AC
  Filled 2020-07-21: qty 250

## 2020-07-21 MED ORDER — HEPARIN SOD (PORK) LOCK FLUSH 100 UNIT/ML IV SOLN
500.0000 [IU] | Freq: Once | INTRAVENOUS | Status: AC | PRN
  Administered 2020-07-21: 500 [IU]
  Filled 2020-07-21: qty 5

## 2020-07-21 NOTE — Progress Notes (Signed)
Pt tolerated infusion well with no complications. VSS. Pt stable for discharge. Pt discharged home.   Cathyann Kilfoyle  

## 2020-07-21 NOTE — Progress Notes (Signed)
Has a pain and burning sensation in left ear and head since radiation on Friday. Took temp in that ear and it was 101.5 on Saturday. States that she can not swallow well and has pain in mouth. Uses magic mouthwash to try to help.

## 2020-07-21 NOTE — Telephone Encounter (Signed)
[facility] ([facility]) - [id number] 5MG  tablets     Status: PA Response - Approved  Created: November 22nd, 2021 [id number]  Sent: November 22nd, 2021
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
## 2020-07-21 NOTE — Assessment & Plan Note (Addendum)
#   Anal cancer/verge SCC- stage II [T2N0M0]; currently on concurrent chemoradiation-5-FU mitomycin day# 15.  Patient tolerating chemotherapy fairly well except for mucositis/jaw pain [see below].  CBC/CMP within normal limits.  # left jaw pain/left external ear "subjective feeling of hot"-etiology unclear recommend CT soft tissue neck ASAP.  No obvious external/middle ear etiology noted.  Await above CT scan.  # Mucositis-from chemotherapy-continue Magic mouthwash hydrocodone pain medication as needed.  Given poor p.o. intake recommend IV fluids.  #Chronic back pain/joint pain stable.  # DISPOSITION: # IVFs 1 hour today  # CT scan- STAT neck/ tomorrrow.  # follow up TBD- Dr.B

## 2020-07-21 NOTE — Telephone Encounter (Signed)
[place]: [place] Case ID: [id number] - Rx #: [id number]  PA submitted for patient's valium

## 2020-07-22 ENCOUNTER — Ambulatory Visit
Admission: RE | Admit: 2020-07-22 | Discharge: 2020-07-22 | Disposition: A | Discharge status: Home / Self Care | Payer: Medicare Other | Source: Ambulatory Visit | Attending: Radiation Oncology | Admitting: Radiation Oncology

## 2020-07-22 ENCOUNTER — Ambulatory Visit
Admission: RE | Admit: 2020-07-22 | Discharge: 2020-07-22 | Disposition: A | Discharge status: Home / Self Care | Payer: Medicare Other | Source: Ambulatory Visit | Attending: Internal Medicine | Admitting: Internal Medicine

## 2020-07-22 ENCOUNTER — Other Ambulatory Visit: Payer: Self-pay

## 2020-07-22 DIAGNOSIS — C210 Malignant neoplasm of anus, unspecified: Secondary | ICD-10-CM | POA: Diagnosis not present

## 2020-07-22 DIAGNOSIS — M542 Cervicalgia: Secondary | ICD-10-CM | POA: Diagnosis not present

## 2020-07-22 MED ORDER — IOHEXOL 300 MG/ML  SOLN
75.0000 mL | Freq: Once | INTRAMUSCULAR | Status: AC | PRN
  Administered 2020-07-22: 75 mL via INTRAVENOUS

## 2020-07-23 ENCOUNTER — Telehealth: Payer: Self-pay | Admitting: Internal Medicine

## 2020-07-23 ENCOUNTER — Other Ambulatory Visit: Payer: Self-pay | Admitting: Internal Medicine

## 2020-07-23 ENCOUNTER — Ambulatory Visit
Admission: RE | Admit: 2020-07-23 | Discharge: 2020-07-23 | Disposition: A | Discharge status: Home / Self Care | Payer: Medicare Other | Source: Ambulatory Visit | Attending: Radiation Oncology | Admitting: Radiation Oncology

## 2020-07-23 DIAGNOSIS — C210 Malignant neoplasm of anus, unspecified: Secondary | ICD-10-CM | POA: Diagnosis not present

## 2020-07-23 NOTE — Telephone Encounter (Signed)
Lab orders entered per md order 

## 2020-07-23 NOTE — Telephone Encounter (Signed)
On 11/23-spoke to patient regarding results of the CT scan negative for any acute process/question pain related to mucositis/referred pain to the ear. Continue radiation as planned.    C- schedule appt- on dec 6th-MD; labs- cbc/cmp; 5FU pump-Mitomycin;pump off on dec 10th.  GB

## 2020-07-23 NOTE — Addendum Note (Signed)
Addended by: Gloris Ham on: 07/23/2020 08:40 AM   Modules accepted: Orders

## 2020-07-28 ENCOUNTER — Ambulatory Visit
Admission: RE | Admit: 2020-07-28 | Discharge: 2020-07-28 | Disposition: A | Discharge status: Home / Self Care | Payer: Medicare Other | Source: Ambulatory Visit | Attending: Radiation Oncology | Admitting: Radiation Oncology

## 2020-07-28 DIAGNOSIS — C210 Malignant neoplasm of anus, unspecified: Secondary | ICD-10-CM | POA: Diagnosis not present

## 2020-07-29 ENCOUNTER — Ambulatory Visit
Admission: RE | Admit: 2020-07-29 | Discharge: 2020-07-29 | Disposition: A | Discharge status: Home / Self Care | Payer: Medicare Other | Source: Ambulatory Visit | Attending: Radiation Oncology | Admitting: Radiation Oncology

## 2020-07-29 DIAGNOSIS — C210 Malignant neoplasm of anus, unspecified: Secondary | ICD-10-CM | POA: Diagnosis not present

## 2020-07-30 ENCOUNTER — Ambulatory Visit
Admission: RE | Admit: 2020-07-30 | Discharge: 2020-07-30 | Disposition: A | Discharge status: Home / Self Care | Payer: Medicare Other | Source: Ambulatory Visit | Attending: Radiation Oncology | Admitting: Radiation Oncology

## 2020-07-30 DIAGNOSIS — C210 Malignant neoplasm of anus, unspecified: Secondary | ICD-10-CM | POA: Diagnosis present

## 2020-07-31 ENCOUNTER — Ambulatory Visit
Admission: RE | Admit: 2020-07-31 | Discharge: 2020-07-31 | Disposition: A | Discharge status: Home / Self Care | Payer: Medicare Other | Source: Ambulatory Visit | Attending: Radiation Oncology | Admitting: Radiation Oncology

## 2020-07-31 DIAGNOSIS — C210 Malignant neoplasm of anus, unspecified: Secondary | ICD-10-CM | POA: Diagnosis not present

## 2020-08-01 ENCOUNTER — Ambulatory Visit
Admission: RE | Admit: 2020-08-01 | Discharge: 2020-08-01 | Disposition: A | Discharge status: Home / Self Care | Payer: Medicare Other | Source: Ambulatory Visit | Attending: Radiation Oncology | Admitting: Radiation Oncology

## 2020-08-01 DIAGNOSIS — C210 Malignant neoplasm of anus, unspecified: Secondary | ICD-10-CM | POA: Diagnosis not present

## 2020-08-04 ENCOUNTER — Other Ambulatory Visit: Payer: Self-pay

## 2020-08-04 ENCOUNTER — Inpatient Hospital Stay: Payer: Medicare Other | Attending: Internal Medicine

## 2020-08-04 ENCOUNTER — Inpatient Hospital Stay (HOSPITAL_BASED_OUTPATIENT_CLINIC_OR_DEPARTMENT_OTHER): Payer: Medicare Other | Admitting: Internal Medicine

## 2020-08-04 ENCOUNTER — Inpatient Hospital Stay: Payer: Medicare Other

## 2020-08-04 ENCOUNTER — Ambulatory Visit
Admission: RE | Admit: 2020-08-04 | Discharge: 2020-08-04 | Disposition: A | Discharge status: Home / Self Care | Payer: Medicare Other | Source: Ambulatory Visit | Attending: Radiation Oncology | Admitting: Radiation Oncology

## 2020-08-04 ENCOUNTER — Encounter: Payer: Self-pay | Admitting: Internal Medicine

## 2020-08-04 DIAGNOSIS — R6884 Jaw pain: Secondary | ICD-10-CM | POA: Diagnosis not present

## 2020-08-04 DIAGNOSIS — Z5111 Encounter for antineoplastic chemotherapy: Secondary | ICD-10-CM | POA: Diagnosis not present

## 2020-08-04 DIAGNOSIS — Z803 Family history of malignant neoplasm of breast: Secondary | ICD-10-CM | POA: Diagnosis not present

## 2020-08-04 DIAGNOSIS — I10 Essential (primary) hypertension: Secondary | ICD-10-CM | POA: Diagnosis not present

## 2020-08-04 DIAGNOSIS — C210 Malignant neoplasm of anus, unspecified: Secondary | ICD-10-CM

## 2020-08-04 DIAGNOSIS — E039 Hypothyroidism, unspecified: Secondary | ICD-10-CM | POA: Diagnosis not present

## 2020-08-04 DIAGNOSIS — M549 Dorsalgia, unspecified: Secondary | ICD-10-CM | POA: Diagnosis not present

## 2020-08-04 DIAGNOSIS — G8929 Other chronic pain: Secondary | ICD-10-CM | POA: Insufficient documentation

## 2020-08-04 DIAGNOSIS — Z780 Asymptomatic menopausal state: Secondary | ICD-10-CM | POA: Insufficient documentation

## 2020-08-04 DIAGNOSIS — K1230 Oral mucositis (ulcerative), unspecified: Secondary | ICD-10-CM | POA: Diagnosis not present

## 2020-08-04 DIAGNOSIS — M2550 Pain in unspecified joint: Secondary | ICD-10-CM | POA: Diagnosis not present

## 2020-08-04 DIAGNOSIS — R197 Diarrhea, unspecified: Secondary | ICD-10-CM | POA: Diagnosis not present

## 2020-08-04 DIAGNOSIS — Z96652 Presence of left artificial knee joint: Secondary | ICD-10-CM | POA: Insufficient documentation

## 2020-08-04 LAB — COMPREHENSIVE METABOLIC PANEL
ALT: 17 U/L (ref 0–44)
AST: 26 U/L (ref 15–41)
Albumin: 3.9 g/dL (ref 3.5–5.0)
Alkaline Phosphatase: 62 U/L (ref 38–126)
Anion gap: 9 (ref 5–15)
BUN: 13 mg/dL (ref 8–23)
CO2: 25 mmol/L (ref 22–32)
Calcium: 9 mg/dL (ref 8.9–10.3)
Chloride: 103 mmol/L (ref 98–111)
Creatinine, Ser: 0.88 mg/dL (ref 0.44–1.00)
GFR, Estimated: >60 mL/min (ref >60)
Glucose, Bld: 102 mg/dL — ABNORMAL HIGH (ref 70–99)
Potassium: 3.8 mmol/L (ref 3.5–5.1)
Sodium: 137 mmol/L (ref 135–145)
Total Bilirubin: 0.6 mg/dL (ref 0.3–1.2)
Total Protein: 6.7 g/dL (ref 6.5–8.1)

## 2020-08-04 LAB — CBC WITH DIFFERENTIAL/PLATELET
Abs Immature Granulocytes: 0.03 10*3/uL (ref 0.00–0.07)
Basophils Absolute: 0.1 10*3/uL (ref 0.0–0.1)
Basophils Relative: 1 %
Eosinophils Absolute: 0.3 10*3/uL (ref 0.0–0.5)
Eosinophils Relative: 4 %
HCT: 32.2 % — ABNORMAL LOW (ref 36.0–46.0)
Hemoglobin: 11.2 g/dL — ABNORMAL LOW (ref 12.0–15.0)
Immature Granulocytes: 1 %
Lymphocytes Relative: 6 %
Lymphs Abs: 0.4 10*3/uL — ABNORMAL LOW (ref 0.7–4.0)
MCH: 32.8 pg (ref 26.0–34.0)
MCHC: 34.8 g/dL (ref 30.0–36.0)
MCV: 94.4 fL (ref 80.0–100.0)
Monocytes Absolute: 0.6 10*3/uL (ref 0.1–1.0)
Monocytes Relative: 9 %
Neutro Abs: 5 10*3/uL (ref 1.7–7.7)
Neutrophils Relative %: 79 %
Platelets: 174 10*3/uL (ref 150–400)
RBC: 3.41 MIL/uL — ABNORMAL LOW (ref 3.87–5.11)
RDW: 13.8 % (ref 11.5–15.5)
WBC: 6.3 10*3/uL (ref 4.0–10.5)
nRBC: 0 % (ref 0.0–0.2)

## 2020-08-04 MED ORDER — HEPARIN SOD (PORK) LOCK FLUSH 100 UNIT/ML IV SOLN
500.0000 [IU] | Freq: Once | INTRAVENOUS | Status: AC
  Administered 2020-08-04: 500 [IU] via INTRAVENOUS
  Filled 2020-08-04: qty 5

## 2020-08-04 NOTE — Progress Notes (Signed)
States the site they do radiation is making her have a raw and stingy feeling inside. Where her lower stomach is to the right feels raw. States she has been using neosporin.
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
## 2020-08-04 NOTE — Addendum Note (Signed)
Addended by: Sofie Rower A on: 08/04/2020 09:11 AM   Modules accepted: Orders, SmartSet

## 2020-08-04 NOTE — Assessment & Plan Note (Addendum)
#   Anal cancer/verge SCC- stage II [T2N0M0]; currently on concurrent chemoradiation-5-FU mitomycin day# 28.  Patient tolerating chemotherapy fairly well except for mucositis/jaw pain.  # HOLD chemo today [pt preference] #28-5FU-Mitomycin today. Labs today reviewed.  We will proceed with chemotherapy next week.  Patient finishing radiation on 12/21.   # left jaw pain/ mucositis- improved.  Continue baking soda/salt water rinses.  Continue Magic mouthwash.  # diarrhea-grade 1; from chemotherapy/radiation symptomatic management stable monitor for now  #Chronic back pain/joint pain- STABLE.   # DISPOSITION: # HOLD chemo today; de-access # follow up in 1 week- MD;l abs;cbc/cmp chemo- 5FU pump over 4 days.-Mitomycin - pump off on 12/17- Dr.B

## 2020-08-05 ENCOUNTER — Ambulatory Visit
Admission: RE | Admit: 2020-08-05 | Discharge: 2020-08-05 | Disposition: A | Discharge status: Home / Self Care | Payer: Medicare Other | Source: Ambulatory Visit | Attending: Radiation Oncology | Admitting: Radiation Oncology

## 2020-08-05 DIAGNOSIS — C210 Malignant neoplasm of anus, unspecified: Secondary | ICD-10-CM | POA: Diagnosis not present

## 2020-08-06 ENCOUNTER — Ambulatory Visit
Admission: RE | Admit: 2020-08-06 | Discharge: 2020-08-06 | Disposition: A | Discharge status: Home / Self Care | Payer: Medicare Other | Source: Ambulatory Visit | Attending: Radiation Oncology | Admitting: Radiation Oncology

## 2020-08-06 DIAGNOSIS — C210 Malignant neoplasm of anus, unspecified: Secondary | ICD-10-CM | POA: Diagnosis not present

## 2020-08-07 ENCOUNTER — Ambulatory Visit
Admission: RE | Admit: 2020-08-07 | Discharge: 2020-08-07 | Disposition: A | Discharge status: Home / Self Care | Payer: Medicare Other | Source: Ambulatory Visit | Attending: Radiation Oncology | Admitting: Radiation Oncology

## 2020-08-07 DIAGNOSIS — C210 Malignant neoplasm of anus, unspecified: Secondary | ICD-10-CM | POA: Diagnosis not present

## 2020-08-08 ENCOUNTER — Other Ambulatory Visit: Payer: Self-pay

## 2020-08-08 ENCOUNTER — Ambulatory Visit
Admission: RE | Admit: 2020-08-08 | Discharge: 2020-08-08 | Disposition: A | Discharge status: Home / Self Care | Payer: Medicare Other | Source: Ambulatory Visit | Attending: Radiation Oncology | Admitting: Radiation Oncology

## 2020-08-08 DIAGNOSIS — C210 Malignant neoplasm of anus, unspecified: Secondary | ICD-10-CM | POA: Diagnosis not present

## 2020-08-08 MED ORDER — PROCHLORPERAZINE MALEATE 10 MG PO TABS: 10.0000 mg | ORAL_TABLET | Freq: Four times a day (QID) | ORAL | 1 refills | Status: DC | PRN

## 2020-08-08 MED ORDER — ONDANSETRON HCL 8 MG PO TABS
ORAL_TABLET | ORAL | 1 refills | Status: DC
Start: 2020-08-08 — End: 2022-06-24

## 2020-08-08 NOTE — Progress Notes (Signed)
Patient called/ pre- screened for appoinmentwith oncologist. Concerns of diarrhea with blood swear and nausea. Patient stated she will inform MD.

## 2020-08-11 ENCOUNTER — Inpatient Hospital Stay (HOSPITAL_BASED_OUTPATIENT_CLINIC_OR_DEPARTMENT_OTHER): Payer: Medicare Other | Admitting: Internal Medicine

## 2020-08-11 ENCOUNTER — Inpatient Hospital Stay: Payer: Medicare Other

## 2020-08-11 ENCOUNTER — Ambulatory Visit
Admission: RE | Admit: 2020-08-11 | Discharge: 2020-08-11 | Disposition: A | Discharge status: Home / Self Care | Payer: Medicare Other | Source: Ambulatory Visit | Attending: Radiation Oncology | Admitting: Radiation Oncology

## 2020-08-11 DIAGNOSIS — C210 Malignant neoplasm of anus, unspecified: Secondary | ICD-10-CM

## 2020-08-11 DIAGNOSIS — Z5111 Encounter for antineoplastic chemotherapy: Secondary | ICD-10-CM | POA: Diagnosis not present

## 2020-08-11 LAB — CBC WITH DIFFERENTIAL/PLATELET
Abs Immature Granulocytes: 0.04 10*3/uL (ref 0.00–0.07)
Basophils Absolute: 0 10*3/uL (ref 0.0–0.1)
Basophils Relative: 1 %
Eosinophils Absolute: 0.5 10*3/uL (ref 0.0–0.5)
Eosinophils Relative: 8 %
HCT: 31.6 % — ABNORMAL LOW (ref 36.0–46.0)
Hemoglobin: 11 g/dL — ABNORMAL LOW (ref 12.0–15.0)
Immature Granulocytes: 1 %
Lymphocytes Relative: 5 %
Lymphs Abs: 0.3 10*3/uL — ABNORMAL LOW (ref 0.7–4.0)
MCH: 33.2 pg (ref 26.0–34.0)
MCHC: 34.8 g/dL (ref 30.0–36.0)
MCV: 95.5 fL (ref 80.0–100.0)
Monocytes Absolute: 0.6 10*3/uL (ref 0.1–1.0)
Monocytes Relative: 11 %
Neutro Abs: 4.3 10*3/uL (ref 1.7–7.7)
Neutrophils Relative %: 74 %
Platelets: 217 10*3/uL (ref 150–400)
RBC: 3.31 MIL/uL — ABNORMAL LOW (ref 3.87–5.11)
RDW: 14.3 % (ref 11.5–15.5)
WBC: 5.7 10*3/uL (ref 4.0–10.5)
nRBC: 0 % (ref 0.0–0.2)

## 2020-08-11 LAB — COMPREHENSIVE METABOLIC PANEL
ALT: 18 U/L (ref 0–44)
AST: 26 U/L (ref 15–41)
Albumin: 3.8 g/dL (ref 3.5–5.0)
Alkaline Phosphatase: 61 U/L (ref 38–126)
Anion gap: 8 (ref 5–15)
BUN: 11 mg/dL (ref 8–23)
CO2: 25 mmol/L (ref 22–32)
Calcium: 8.9 mg/dL (ref 8.9–10.3)
Chloride: 102 mmol/L (ref 98–111)
Creatinine, Ser: 0.84 mg/dL (ref 0.44–1.00)
GFR, Estimated: >60 mL/min (ref >60)
Glucose, Bld: 101 mg/dL — ABNORMAL HIGH (ref 70–99)
Potassium: 3.8 mmol/L (ref 3.5–5.1)
Sodium: 135 mmol/L (ref 135–145)
Total Bilirubin: 0.6 mg/dL (ref 0.3–1.2)
Total Protein: 6.9 g/dL (ref 6.5–8.1)

## 2020-08-11 MED ORDER — SODIUM CHLORIDE 0.9 % IV SOLN
Freq: Once | INTRAVENOUS | Status: AC
  Filled 2020-08-11: qty 250

## 2020-08-11 MED ORDER — HEPARIN SOD (PORK) LOCK FLUSH 100 UNIT/ML IV SOLN
500.0000 [IU] | Freq: Once | INTRAVENOUS | Status: DC
  Filled 2020-08-11: qty 5

## 2020-08-11 MED ORDER — PROCHLORPERAZINE MALEATE 10 MG PO TABS
10.0000 mg | ORAL_TABLET | Freq: Once | ORAL | Status: AC
  Administered 2020-08-11: 10:00:00 10 mg via ORAL
  Filled 2020-08-11: qty 1

## 2020-08-11 MED ORDER — SODIUM CHLORIDE 0.9 % IV SOLN
8000.0000 mg | INTRAVENOUS | Status: DC
  Administered 2020-08-11: 11:00:00 8000 mg via INTRAVENOUS
  Filled 2020-08-11: qty 160

## 2020-08-11 MED ORDER — MAGIC MOUTHWASH W/LIDOCAINE: 5.0000 mL | Freq: Four times a day (QID) | ORAL | 3 refills | Status: DC

## 2020-08-11 MED ORDER — FLUCONAZOLE 100 MG PO TABS: 100.0000 mg | ORAL_TABLET | Freq: Every day | ORAL | 0 refills | Status: DC

## 2020-08-11 MED ORDER — SODIUM CHLORIDE 0.9% FLUSH
10.0000 mL | Freq: Once | INTRAVENOUS | Status: AC
  Administered 2020-08-11: 08:00:00 10 mL via INTRAVENOUS
  Filled 2020-08-11: qty 10

## 2020-08-11 MED ORDER — MITOMYCIN CHEMO IV INJECTION 40 MG
10.0000 mg/m2 | Freq: Once | INTRAVENOUS | Status: AC
  Administered 2020-08-11: 10:00:00 20 mg via INTRAVENOUS
  Filled 2020-08-11: qty 40

## 2020-08-11 NOTE — Progress Notes (Signed)
Stable at discharge 

## 2020-08-11 NOTE — Patient Instructions (Signed)
Follow up with Dr.[name] in 1 month for exam,

## 2020-08-11 NOTE — Assessment & Plan Note (Addendum)
#   Anal cancer/verge SCC- stage II [T2N0M0]; currently on concurrent chemoradiation-5-FU mitomycin day# 28.  Patient tolerating chemotherapy fairly well except for mucositis/jaw pain.  # Proceed with day# #28-5FU-Mitomycin today. Labs today reviewed. Patient finishing radiation on 12/21. Will plan to repeat PET scan scan in 8-12 weeks.   # left jaw pain/ mucositis- Continue baking soda/salt water rinses.  Continue Magic mouthwash; diflucan prophylaxis.   # diarrhea/constipation- -grade 1; from chemotherapy/radiation symptomatic management stable monitor for now  #Chronic back pain/joint pain- STABLE.   # DISPOSITION:magic outh wash/componded [facility] # chemo today;  # 12/21-lab- cbc/bmp IVFs over 1 hour # 12/28-lab;cbc/bmp IVFs over 1 hour # follow up in 6 week- MD;labs;cbc/cmp chemo-- Dr.B
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
## 2020-08-11 NOTE — Progress Notes (Signed)
Progress note says to hold chemo, MD says we are proceeding with chemo today.

## 2020-08-11 NOTE — Addendum Note (Signed)
Addended by: Gloris Ham on: 08/11/2020 03:53 PM   Modules accepted: Orders

## 2020-08-12 ENCOUNTER — Ambulatory Visit
Admission: RE | Admit: 2020-08-12 | Discharge: 2020-08-12 | Disposition: A | Discharge status: Home / Self Care | Payer: Medicare Other | Source: Ambulatory Visit | Attending: Radiation Oncology | Admitting: Radiation Oncology

## 2020-08-12 DIAGNOSIS — C210 Malignant neoplasm of anus, unspecified: Secondary | ICD-10-CM | POA: Diagnosis not present

## 2020-08-13 ENCOUNTER — Ambulatory Visit
Admission: RE | Admit: 2020-08-13 | Discharge: 2020-08-13 | Disposition: A | Discharge status: Home / Self Care | Payer: Medicare Other | Source: Ambulatory Visit | Attending: Radiation Oncology | Admitting: Radiation Oncology

## 2020-08-13 DIAGNOSIS — C210 Malignant neoplasm of anus, unspecified: Secondary | ICD-10-CM | POA: Diagnosis not present

## 2020-08-14 ENCOUNTER — Ambulatory Visit
Admission: RE | Admit: 2020-08-14 | Discharge: 2020-08-14 | Disposition: A | Discharge status: Home / Self Care | Payer: Medicare Other | Source: Ambulatory Visit | Attending: Radiation Oncology | Admitting: Radiation Oncology

## 2020-08-14 DIAGNOSIS — C210 Malignant neoplasm of anus, unspecified: Secondary | ICD-10-CM | POA: Diagnosis not present

## 2020-08-15 ENCOUNTER — Ambulatory Visit
Admission: RE | Admit: 2020-08-15 | Discharge: 2020-08-15 | Disposition: A | Discharge status: Home / Self Care | Payer: Medicare Other | Source: Ambulatory Visit | Attending: Radiation Oncology | Admitting: Radiation Oncology

## 2020-08-15 ENCOUNTER — Inpatient Hospital Stay: Payer: Medicare Other

## 2020-08-15 ENCOUNTER — Other Ambulatory Visit: Payer: Self-pay

## 2020-08-15 VITALS — BP 141/70 | HR 79 | Temp 98.4°F | Resp 20

## 2020-08-15 DIAGNOSIS — Z5111 Encounter for antineoplastic chemotherapy: Secondary | ICD-10-CM | POA: Diagnosis not present

## 2020-08-15 DIAGNOSIS — C210 Malignant neoplasm of anus, unspecified: Secondary | ICD-10-CM

## 2020-08-15 MED ORDER — HEPARIN SOD (PORK) LOCK FLUSH 100 UNIT/ML IV SOLN
500.0000 [IU] | Freq: Once | INTRAVENOUS | Status: AC | PRN
  Administered 2020-08-15: 11:00:00 500 [IU]
  Filled 2020-08-15: qty 5

## 2020-08-15 MED ORDER — SODIUM CHLORIDE 0.9% FLUSH
10.0000 mL | INTRAVENOUS | Status: DC | PRN
  Administered 2020-08-15: 11:00:00 10 mL
  Filled 2020-08-15: qty 10

## 2020-08-15 MED ORDER — HEPARIN SOD (PORK) LOCK FLUSH 100 UNIT/ML IV SOLN
INTRAVENOUS | Status: AC
  Filled 2020-08-15: qty 5

## 2020-08-15 NOTE — Progress Notes (Signed)
1108- Fluorouracil Pump disconnected and port-a-cath de-accessed. Patient and vital signs stable. Patient discharged to home at this time.

## 2020-08-18 ENCOUNTER — Ambulatory Visit
Admission: RE | Admit: 2020-08-18 | Discharge: 2020-08-18 | Disposition: A | Discharge status: Home / Self Care | Payer: Medicare Other | Source: Ambulatory Visit | Attending: Radiation Oncology | Admitting: Radiation Oncology

## 2020-08-18 DIAGNOSIS — C210 Malignant neoplasm of anus, unspecified: Secondary | ICD-10-CM | POA: Diagnosis not present

## 2020-08-19 ENCOUNTER — Inpatient Hospital Stay: Payer: Medicare Other

## 2020-08-19 ENCOUNTER — Ambulatory Visit
Admission: RE | Admit: 2020-08-19 | Discharge: 2020-08-19 | Disposition: A | Discharge status: Home / Self Care | Payer: Medicare Other | Source: Ambulatory Visit | Attending: Radiation Oncology | Admitting: Radiation Oncology

## 2020-08-19 VITALS — BP 133/72 | HR 74 | Temp 96.7°F

## 2020-08-19 DIAGNOSIS — C210 Malignant neoplasm of anus, unspecified: Secondary | ICD-10-CM

## 2020-08-19 DIAGNOSIS — E860 Dehydration: Secondary | ICD-10-CM

## 2020-08-19 DIAGNOSIS — Z5111 Encounter for antineoplastic chemotherapy: Secondary | ICD-10-CM | POA: Diagnosis not present

## 2020-08-19 LAB — CBC WITH DIFFERENTIAL/PLATELET
Abs Immature Granulocytes: 0.04 10*3/uL (ref 0.00–0.07)
Basophils Absolute: 0 10*3/uL (ref 0.0–0.1)
Basophils Relative: 0 %
Eosinophils Absolute: 0.2 10*3/uL (ref 0.0–0.5)
Eosinophils Relative: 6 %
HCT: 32 % — ABNORMAL LOW (ref 36.0–46.0)
Hemoglobin: 11 g/dL — ABNORMAL LOW (ref 12.0–15.0)
Immature Granulocytes: 1 %
Lymphocytes Relative: 5 %
Lymphs Abs: 0.2 10*3/uL — ABNORMAL LOW (ref 0.7–4.0)
MCH: 32.8 pg (ref 26.0–34.0)
MCHC: 34.4 g/dL (ref 30.0–36.0)
MCV: 95.5 fL (ref 80.0–100.0)
Monocytes Absolute: 0.1 10*3/uL (ref 0.1–1.0)
Monocytes Relative: 3 %
Neutro Abs: 3.1 10*3/uL (ref 1.7–7.7)
Neutrophils Relative %: 85 %
Platelets: 209 10*3/uL (ref 150–400)
RBC: 3.35 MIL/uL — ABNORMAL LOW (ref 3.87–5.11)
RDW: 14 % (ref 11.5–15.5)
WBC: 3.7 10*3/uL — ABNORMAL LOW (ref 4.0–10.5)
nRBC: 0 % (ref 0.0–0.2)

## 2020-08-19 LAB — BASIC METABOLIC PANEL
Anion gap: 7 (ref 5–15)
BUN: 13 mg/dL (ref 8–23)
CO2: 25 mmol/L (ref 22–32)
Calcium: 8.9 mg/dL (ref 8.9–10.3)
Chloride: 102 mmol/L (ref 98–111)
Creatinine, Ser: 0.83 mg/dL (ref 0.44–1.00)
GFR, Estimated: >60 mL/min (ref >60)
Glucose, Bld: 92 mg/dL (ref 70–99)
Potassium: 3.9 mmol/L (ref 3.5–5.1)
Sodium: 134 mmol/L — ABNORMAL LOW (ref 135–145)

## 2020-08-19 MED ORDER — SODIUM CHLORIDE 0.9% FLUSH
10.0000 mL | INTRAVENOUS | Status: AC | PRN
  Administered 2020-08-19: 09:00:00 10 mL via INTRAVENOUS
  Filled 2020-08-19: qty 10

## 2020-08-19 MED ORDER — SODIUM CHLORIDE 0.9 % IV SOLN
Freq: Once | INTRAVENOUS | Status: AC
  Filled 2020-08-19: qty 250

## 2020-08-19 MED ORDER — HEPARIN SOD (PORK) LOCK FLUSH 100 UNIT/ML IV SOLN
500.0000 [IU] | Freq: Once | INTRAVENOUS | Status: AC
  Administered 2020-08-19: 10:00:00 500 [IU] via INTRAVENOUS
  Filled 2020-08-19: qty 5

## 2020-08-20 ENCOUNTER — Other Ambulatory Visit: Payer: Self-pay | Admitting: *Deleted

## 2020-08-20 MED ORDER — HYDROCODONE-ACETAMINOPHEN 5-325 MG PO TABS: 1.0000 | ORAL_TABLET | Freq: Four times a day (QID) | ORAL | 0 refills | Status: DC | PRN

## 2020-08-26 ENCOUNTER — Inpatient Hospital Stay: Payer: Medicare Other

## 2020-08-26 VITALS — BP 159/74 | HR 74 | Temp 98.0°F | Resp 18

## 2020-08-26 DIAGNOSIS — E860 Dehydration: Secondary | ICD-10-CM

## 2020-08-26 DIAGNOSIS — Z5111 Encounter for antineoplastic chemotherapy: Secondary | ICD-10-CM | POA: Diagnosis not present

## 2020-08-26 DIAGNOSIS — C210 Malignant neoplasm of anus, unspecified: Secondary | ICD-10-CM

## 2020-08-26 LAB — BASIC METABOLIC PANEL WITH GFR
Anion gap: 11 (ref 5–15)
BUN: 12 mg/dL (ref 8–23)
CO2: 23 mmol/L (ref 22–32)
Calcium: 8.8 mg/dL — ABNORMAL LOW (ref 8.9–10.3)
Chloride: 101 mmol/L (ref 98–111)
Creatinine, Ser: 0.8 mg/dL (ref 0.44–1.00)
GFR, Estimated: >60 mL/min
Glucose, Bld: 99 mg/dL (ref 70–99)
Potassium: 3.9 mmol/L (ref 3.5–5.1)
Sodium: 135 mmol/L (ref 135–145)

## 2020-08-26 LAB — CBC WITH DIFFERENTIAL/PLATELET
Abs Immature Granulocytes: 0.02 10*3/uL (ref 0.00–0.07)
Basophils Absolute: 0 10*3/uL (ref 0.0–0.1)
Basophils Relative: 1 %
Eosinophils Absolute: 0.2 10*3/uL (ref 0.0–0.5)
Eosinophils Relative: 12 %
HCT: 32.7 % — ABNORMAL LOW (ref 36.0–46.0)
Hemoglobin: 11.2 g/dL — ABNORMAL LOW (ref 12.0–15.0)
Immature Granulocytes: 1 %
Lymphocytes Relative: 14 %
Lymphs Abs: 0.2 10*3/uL — ABNORMAL LOW (ref 0.7–4.0)
MCH: 33.1 pg (ref 26.0–34.0)
MCHC: 34.3 g/dL (ref 30.0–36.0)
MCV: 96.7 fL (ref 80.0–100.0)
Monocytes Absolute: 0.5 10*3/uL (ref 0.1–1.0)
Monocytes Relative: 30 %
Neutro Abs: 0.7 10*3/uL — ABNORMAL LOW (ref 1.7–7.7)
Neutrophils Relative %: 42 %
Platelets: 88 10*3/uL — ABNORMAL LOW (ref 150–400)
RBC: 3.38 MIL/uL — ABNORMAL LOW (ref 3.87–5.11)
RDW: 15.4 % (ref 11.5–15.5)
WBC: 1.7 10*3/uL — ABNORMAL LOW (ref 4.0–10.5)
nRBC: 0 % (ref 0.0–0.2)

## 2020-08-26 MED ORDER — SODIUM CHLORIDE 0.9 % IV SOLN
Freq: Once | INTRAVENOUS | Status: AC
  Filled 2020-08-26: qty 250

## 2020-08-26 MED ORDER — SODIUM CHLORIDE 0.9% FLUSH
10.0000 mL | Freq: Once | INTRAVENOUS | Status: AC | PRN
  Administered 2020-08-26: 09:00:00 10 mL
  Filled 2020-08-26: qty 10

## 2020-08-26 MED ORDER — HEPARIN SOD (PORK) LOCK FLUSH 100 UNIT/ML IV SOLN
500.0000 [IU] | Freq: Once | INTRAVENOUS | Status: AC | PRN
  Administered 2020-08-26: 10:00:00 500 [IU]
  Filled 2020-08-26: qty 5

## 2020-08-26 NOTE — Progress Notes (Signed)
Pt here for labs and IVF's. Discussed neutropenic precautions with pt as her ANC s low. VSS. Afebrile. Instructed pt to call office if fever >100.4 . Received 1 liter of NS. No complaints at time of discharge. Ambulatory.

## 2020-09-19 ENCOUNTER — Encounter: Payer: Self-pay | Admitting: Internal Medicine

## 2020-09-22 ENCOUNTER — Ambulatory Visit
Admission: RE | Admit: 2020-09-22 | Discharge: 2020-09-22 | Disposition: A | Discharge status: Home / Self Care | Payer: Medicare Other | Source: Ambulatory Visit | Attending: Radiation Oncology | Admitting: Radiation Oncology

## 2020-09-22 ENCOUNTER — Inpatient Hospital Stay: Payer: Medicare Other | Attending: Internal Medicine

## 2020-09-22 ENCOUNTER — Inpatient Hospital Stay (HOSPITAL_BASED_OUTPATIENT_CLINIC_OR_DEPARTMENT_OTHER): Payer: Medicare Other | Admitting: Internal Medicine

## 2020-09-22 ENCOUNTER — Other Ambulatory Visit: Payer: Self-pay

## 2020-09-22 DIAGNOSIS — C210 Malignant neoplasm of anus, unspecified: Secondary | ICD-10-CM

## 2020-09-22 DIAGNOSIS — K5900 Constipation, unspecified: Secondary | ICD-10-CM | POA: Insufficient documentation

## 2020-09-22 DIAGNOSIS — Z9221 Personal history of antineoplastic chemotherapy: Secondary | ICD-10-CM | POA: Diagnosis not present

## 2020-09-22 DIAGNOSIS — Z803 Family history of malignant neoplasm of breast: Secondary | ICD-10-CM | POA: Diagnosis not present

## 2020-09-22 DIAGNOSIS — I10 Essential (primary) hypertension: Secondary | ICD-10-CM | POA: Diagnosis not present

## 2020-09-22 DIAGNOSIS — Z79899 Other long term (current) drug therapy: Secondary | ICD-10-CM | POA: Diagnosis not present

## 2020-09-22 DIAGNOSIS — Z923 Personal history of irradiation: Secondary | ICD-10-CM | POA: Diagnosis not present

## 2020-09-22 DIAGNOSIS — Z96652 Presence of left artificial knee joint: Secondary | ICD-10-CM | POA: Insufficient documentation

## 2020-09-22 DIAGNOSIS — M2550 Pain in unspecified joint: Secondary | ICD-10-CM | POA: Insufficient documentation

## 2020-09-22 DIAGNOSIS — K1230 Oral mucositis (ulcerative), unspecified: Secondary | ICD-10-CM | POA: Insufficient documentation

## 2020-09-22 DIAGNOSIS — R6884 Jaw pain: Secondary | ICD-10-CM | POA: Insufficient documentation

## 2020-09-22 DIAGNOSIS — E039 Hypothyroidism, unspecified: Secondary | ICD-10-CM | POA: Diagnosis not present

## 2020-09-22 DIAGNOSIS — R197 Diarrhea, unspecified: Secondary | ICD-10-CM | POA: Insufficient documentation

## 2020-09-22 DIAGNOSIS — G8929 Other chronic pain: Secondary | ICD-10-CM | POA: Insufficient documentation

## 2020-09-22 LAB — CBC WITH DIFFERENTIAL/PLATELET
Abs Immature Granulocytes: 0.16 10*3/uL — ABNORMAL HIGH (ref 0.00–0.07)
Basophils Absolute: 0.1 10*3/uL (ref 0.0–0.1)
Basophils Relative: 1 %
Eosinophils Absolute: 0.1 10*3/uL (ref 0.0–0.5)
Eosinophils Relative: 1 %
HCT: 31.2 % — ABNORMAL LOW (ref 36.0–46.0)
Hemoglobin: 10.8 g/dL — ABNORMAL LOW (ref 12.0–15.0)
Immature Granulocytes: 2 %
Lymphocytes Relative: 5 %
Lymphs Abs: 0.4 10*3/uL — ABNORMAL LOW (ref 0.7–4.0)
MCH: 34.5 pg — ABNORMAL HIGH (ref 26.0–34.0)
MCHC: 34.6 g/dL (ref 30.0–36.0)
MCV: 99.7 fL (ref 80.0–100.0)
Monocytes Absolute: 0.8 10*3/uL (ref 0.1–1.0)
Monocytes Relative: 11 %
Neutro Abs: 5.5 10*3/uL (ref 1.7–7.7)
Neutrophils Relative %: 80 %
Platelets: 246 10*3/uL (ref 150–400)
RBC: 3.13 MIL/uL — ABNORMAL LOW (ref 3.87–5.11)
RDW: 16.7 % — ABNORMAL HIGH (ref 11.5–15.5)
WBC: 6.9 10*3/uL (ref 4.0–10.5)
nRBC: 0 % (ref 0.0–0.2)

## 2020-09-22 LAB — COMPREHENSIVE METABOLIC PANEL
ALT: 16 U/L (ref 0–44)
AST: 26 U/L (ref 15–41)
Albumin: 3.6 g/dL (ref 3.5–5.0)
Alkaline Phosphatase: 83 U/L (ref 38–126)
Anion gap: 9 (ref 5–15)
BUN: 10 mg/dL (ref 8–23)
CO2: 23 mmol/L (ref 22–32)
Calcium: 8.8 mg/dL — ABNORMAL LOW (ref 8.9–10.3)
Chloride: 102 mmol/L (ref 98–111)
Creatinine, Ser: 0.73 mg/dL (ref 0.44–1.00)
GFR, Estimated: >60 mL/min (ref >60)
Glucose, Bld: 101 mg/dL — ABNORMAL HIGH (ref 70–99)
Potassium: 3.7 mmol/L (ref 3.5–5.1)
Sodium: 134 mmol/L — ABNORMAL LOW (ref 135–145)
Total Bilirubin: 0.4 mg/dL (ref 0.3–1.2)
Total Protein: 6.9 g/dL (ref 6.5–8.1)

## 2020-09-22 MED ORDER — SODIUM CHLORIDE 0.9% FLUSH
10.0000 mL | INTRAVENOUS | Status: DC | PRN
  Administered 2020-09-22: 10 mL via INTRAVENOUS
  Filled 2020-09-22: qty 10

## 2020-09-22 MED ORDER — HEPARIN SOD (PORK) LOCK FLUSH 100 UNIT/ML IV SOLN
500.0000 [IU] | Freq: Once | INTRAVENOUS | Status: AC
  Administered 2020-09-22: 500 [IU] via INTRAVENOUS
  Filled 2020-09-22: qty 5

## 2020-09-22 NOTE — Progress Notes (Signed)
Survivorship Care Plan visit completed.  Treatment summary reviewed and given to patient.  ASCO answers booklet reviewed and given to patient.  CARE program and Cancer Transitions discussed with patient along with other resources cancer center offers to patients and caregivers.  Patient verbalized understanding.    

## 2020-09-22 NOTE — Assessment & Plan Note (Addendum)
#   Anal cancer/verge SCC- stage II [T2N0M0]; currently on concurrent chemoradiation-5-FU mitomycin day# 28.  Clinical response noted.   # Patient status post chemo- radiation on 12/21. Will plan to repeat PET scan scan in 6 weeks from now.  PET scan was ordered.  # diarrhea/constipation/blood in stools hemorrhoids- -clinical improvement noted; overall stable.  #Chronic back pain/joint pain-stable.   # DISPOSITION: # follow up in 6 week- MD;labs;cbc/cmp PET prior; port flush-- Dr.B

## 2020-09-22 NOTE — Progress Notes (Signed)
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  # Nov 8th, 2021- 5FU-Mitomycin-RT   # Hypothyroidsm; HTN   # SURVIVORSHIP:   # GENETICS: NA  DIAGNOSIS: Anal cancer  STAGE:   II      ;  GOALS: cure  CURRENT/MOST RECENT THERAPY : Concurrent chemoradiation    Anal cancer ([place])  06/10/2020 Initial Diagnosis   Anal cancer ([place])   07/07/2020 -  Chemotherapy   The patient had mitoMYcin (MUTAMYCIN) chemo injection 20 mg, 10 mg/m2 = 20 mg, Intravenous,  Once, 1 of 1 cycle Administration: 20 mg (07/07/2020), 20 mg (08/11/2020) fluorouracil (ADRUCIL) 8,050 mg in sodium chloride 0.9 % 89 mL chemo infusion, 1,000 mg/m2/day = 8,050 mg, Intravenous, 4D (96 hours ), 1 of 1 cycle Administration: 8,050 mg (07/07/2020), 8,000 mg (08/11/2020)  for chemotherapy treatment.       HISTORY OF PRESENTING ILLNESS:  [name] 70 y.o.  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  In the interim patient was evaluated by surgery, Dr. Bary Scott with anoscopy-that did not show any physical evidence of malignancy.  Patient continues to have intermittent blood in her  stools which is attributed to hemorrhoids.  Otherwise no chest pain or shortness of the cough.   Review of Systems  Constitutional: Negative for chills, diaphoresis, fever and malaise/fatigue.  HENT: Negative for nosebleeds.   Eyes: Negative for double vision.  Respiratory: Negative for cough, hemoptysis, sputum production, shortness of breath and wheezing.   Cardiovascular: Negative for chest pain, palpitations, orthopnea and leg swelling.  Gastrointestinal: Positive for constipation and diarrhea. Negative for abdominal pain, heartburn, melena, nausea and vomiting.  Genitourinary: Negative for dysuria, frequency and urgency.  Musculoskeletal: Positive for back pain and joint pain.  Skin: Negative.  Negative for itching and rash.  Neurological: Negative for dizziness, tingling, focal weakness, weakness and headaches.  Endo/Heme/Allergies: Does not bruise/bleed easily.  Psychiatric/Behavioral: Negative for depression. The patient is not nervous/anxious and does not have insomnia.      MEDICAL HISTORY:  Past Medical History:  Diagnosis Date  . Anal cancer ([place])   . Anemia    H/O  . Anxiety   . Arthritis   . Claustrophobia   . History of hiatal hernia   . Hypertension   . Hypothyroidism     SURGICAL HISTORY: Past Surgical History:  Procedure Laterality Date  . CHOLECYSTECTOMY N/A 02/04/2017   Procedure: LAPAROSCOPIC CHOLECYSTECTOMY;  Surgeon: Mckenzie Signs, MD;  Location: [facility];  Service: General;  Laterality: N/A;  . COLONOSCOPY  05/26/2020  . DIAGNOSTIC LAPAROSCOPY     with removal of adhesions  . FRACTURE SURGERY     Ankle, Femur x 2, Left Arm  . HARDWARE REMOVAL Left 02/16/2017   Procedure: HARDWARE REMOVAL  LEFT KNEE;  Surgeon: Mckenzie Arabian, MD;  Location: [facility];  Service: Orthopedics;  Laterality: Left;  . OPEN REDUCTION NASAL FRACTURE     repair of wrist, nose, ankles, femur and patella from [name].  Marland Kitchen PORTACATH PLACEMENT Left 06/20/2020   Procedure: INSERTION  PORT-A-CATH;  Surgeon: Mckenzie Bellow, MD;  Location: [facility];  Service: General;  Laterality: Left;  . TONSILLECTOMY    . TOTAL KNEE ARTHROPLASTY Left 04/18/2017   Procedure: LEFT TOTAL KNEE ARTHROPLASTY;  Surgeon: Mckenzie Arabian, MD;  Location: [facility];  Service: Orthopedics;  Laterality: Left;  canal block    SOCIAL HISTORY: Social History   Socioeconomic History  . Marital status: Married    Spouse name: Not on file  . Number of children: Not on file  . Years of education: Not on file  . Highest education level: Not on file  Occupational History  . Not on file  Tobacco Use  . Smoking status: Never Smoker  . Smokeless tobacco: Never Used  Vaping Use  . Vaping Use: Never used  Substance and Sexual Activity  . Alcohol use: No  . Drug use: No  . Sexual activity: Yes    Birth control/protection: Post-menopausal  Other Topics Concern  . Not on file  Social History Narrative   [name], [name] over 1 hour. With husband. Never smoked; no alcohol. 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 Breast cancer Paternal Grandmother     ALLERGIES:  is allergic to tegaderm ag mesh [silver].  MEDICATIONS:  Current Outpatient Medications  Medication Sig Dispense Refill  . acetaminophen (TYLENOL) 325 MG tablet Take 650 mg by mouth every 6 (six) hours as needed for moderate pain.    Marland Kitchen CALCIUM-MAGNESIUM-ZINC PO Take 1 tablet by mouth daily.    . fluconazole (DIFLUCAN) 100 MG tablet Take 1 tablet (100 mg total) by mouth daily. 14 tablet 0  . fluticasone (FLONASE) 50 MCG/ACT nasal spray Place 1 spray into both nostrils daily as needed for allergies or rhinitis.     . hydrochlorothiazide (HYDRODIURIL) 12.5 MG tablet Take 12.5 mg by mouth every morning.    Marland Kitchen HYDROcodone-acetaminophen (NORCO) 5-325 MG tablet Take 1 tablet by mouth every 6 (six) hours as needed for severe pain (unrelieved by acetaminophen. Do not exceed 3000 mg of acetaminophen in 24 hour period.). 30 tablet 0  . hydrocortisone (ANUSOL-HC) 25 MG suppository Place rectally.    . irbesartan (AVAPRO) 300 MG tablet Take 300 mg by mouth daily at 3 pm.     . lidocaine-prilocaine (EMLA) cream Apply 1 application topically as needed. 30 g 2  . loperamide (IMODIUM A-D) 2 MG tablet Take 2 mg by mouth 4 (four) times daily as needed for diarrhea or loose stools.    . magic mouthwash w/lidocaine SOLN Take 5 mLs by mouth 4 (four) times daily. 480 mL 3  . Multiple Vitamin (MULTIVITAMIN) tablet Take 1 tablet by mouth daily.    . ondansetron (ZOFRAN) 8 MG tablet One pill every 8 hours as needed for nausea/vomitting. 40 tablet 1  . Polyethyl Glycol-Propyl Glycol (SYSTANE OP) Place 1 drop into both eyes  in the morning.     Marland Kitchen PRESCRIPTION MEDICATION Take 5 mLs by mouth 4 (four) times daily as needed. Medicated Mouthwash Swish and swallow 20ml 4 times daily as needed    . prochlorperazine (COMPAZINE) 10 MG tablet Take 1 tablet (10 mg total) by mouth every 6 (six) hours as needed for nausea or vomiting. 40 tablet 1  . simvastatin (ZOCOR) 20 MG tablet Take 20 mg by mouth at bedtime.     Marland Kitchen SYNTHROID 100 MCG tablet Take 100 mcg by mouth daily before breakfast.     . diazepam (VALIUM) 5 MG tablet One pill 45-60 mins prior to procedure; 1 pill 15 mins prior if needed. (Patient not taking: No sig reported) 5 tablet 0   No current facility-administered medications for this visit.   Facility-Administered Medications Ordered in Other Visits  Medication Dose Route Frequency Provider Last Rate Last Admin  . sodium chloride flush (NS) 0.9 % injection 10 mL  10 mL Intravenous PRN Mckenzie Sickle, MD   10 mL at 08/19/20  0833  . sodium chloride flush (NS) 0.9 % injection 10 mL  10 mL Intravenous PRN Mckenzie Sickle, MD   10 mL at 09/22/20 0925      .  PHYSICAL EXAMINATION: ECOG PERFORMANCE STATUS: 1 - Symptomatic but completely ambulatory  Vitals:   09/22/20 0930  BP: (!) 148/78  Pulse: 90  Resp: 20  Temp: (!) 97.3 F (36.3 C)   Filed Weights   09/22/20 0930  Weight: 189 lb (85.7 kg)    Physical Exam Constitutional:      Comments: Accompanied by family.  Ambulating independently.  HENT:     Head: Normocephalic and atraumatic.     Mouth/Throat:     Pharynx: No oropharyngeal exudate.  Eyes:     Pupils: Pupils are equal, round, and reactive to light.  Cardiovascular:     Rate and Rhythm: Normal rate and regular rhythm.  Pulmonary:     Effort: Pulmonary effort is normal. No respiratory distress.     Breath sounds: Normal breath sounds. No wheezing.  Abdominal:     General: Bowel sounds are normal. There is no distension.     Palpations: Abdomen is soft. There is no mass.     Tenderness: There is no abdominal tenderness. There is no guarding or rebound.  Musculoskeletal:        General: No tenderness. Normal range of motion.     Cervical back: Normal range of motion and neck supple.  Skin:    General: Skin is warm.  Neurological:     Mental Status: She is alert and oriented to person, place, and time.  Psychiatric:        Mood and Affect: Affect normal.      LABORATORY DATA:  I have reviewed the data as listed Lab Results  Component Value Date   WBC 6.9 09/22/2020   HGB 10.8 (L) 09/22/2020   HCT 31.2 (L) 09/22/2020   MCV 99.7 09/22/2020   PLT 246 09/22/2020   Recent Labs    08/04/20 0822 08/11/20 0825 08/19/20 0835 08/26/20 0842 09/22/20 0930  NA 137 135 134* 135 134*  K 3.8 3.8 3.9 3.9 3.7  CL 103 102 102 101 102  CO2 25 25 25 23 23   GLUCOSE 102* 101* 92 99 101*  BUN 13 11 13 12 10   CREATININE 0.88 0.84 0.83 0.80 0.73  CALCIUM 9.0 8.9 8.9 8.8* 8.8*   GFRNONAA >60 >60 >60 >60 >60  PROT 6.7 6.9  --   --  6.9  ALBUMIN 3.9 3.8  --   --  3.6  AST 26 26  --   --  26  ALT 17 18  --   --  16  ALKPHOS 62 61  --   --  83  BILITOT 0.6 0.6  --   --  0.4    RADIOGRAPHIC STUDIES: I have personally reviewed the radiological images as listed and agreed with the findings in the report. No results found.  ASSESSMENT & PLAN:   Anal cancer ([place]) # Anal cancer/verge SCC- stage II [T2N0M0]; currently on concurrent chemoradiation-5-FU mitomycin day# 28.  Patient tolerating chemotherapy fairly well except for mucositis/jaw pain.  # Proceed with day# #28-5FU-Mitomycin today. Labs today reviewed. Patient finishing radiation on 12/21. Will plan to repeat PET scan scan in 8-12 weeks.   # left jaw pain/ mucositis- Continue baking soda/salt water rinses.  Continue Magic mouthwash; diflucan prophylaxis.   # diarrhea/constipation- -grade 1; from chemotherapy/radiation symptomatic management stable monitor for now  #Chronic back pain/joint pain- STABLE.   # DISPOSITION: # follow up in 6 week- MD;labs;cbc/cmp PET prior; port flush-- Dr.B   All questions were answered. The patient knows to call the clinic with any problems, questions or concerns.   Mckenzie Sickle, MD 09/22/2020 2:55 PM

## 2020-09-22 NOTE — Progress Notes (Signed)
Radiation Oncology Follow up Note  Name: [name]   Date:   09/22/2020 MRN:  [id number] DOB: 02-Nov-1950    This 70 y.o. female presents to the clinic today for 1 month follow-up status post concurrent chemoradiation therapy for poorly differentiated squamous cell carcinoma the anus.  REFERRING PROVIDER: [name], MD  HPI: Patient is a 70 year old female now at 1 month having completed concurrent chemoradiation for poorly differentiated squamous cell carcinoma the anus.  Seen today in routine follow-up she is doing well most of her issues have resolved.  She does have some.  Residual irregular bowel movements.  Notes specific increased lower urinary tract symptoms.  No pain on defecation.  PET scan has been ordered for March  COMPLICATIONS OF TREATMENT: none  FOLLOW UP COMPLIANCE: keeps appointments   PHYSICAL EXAM:  There were no vitals taken for this visit. Examination of the anal region shows no evidence of nodularity or mass.  No evidence of inguinal adenopathy is identified.  Well-developed well-nourished patient in NAD. HEENT reveals PERLA, EOMI, discs not visualized.  Oral cavity is clear. No oral mucosal lesions are identified. Neck is clear without evidence of cervical or supraclavicular adenopathy. Lungs are clear to A&P. Cardiac examination is essentially unremarkable with regular rate and rhythm without murmur rub or thrill. Abdomen is benign with no organomegaly or masses noted. Motor sensory and DTR levels are equal and symmetric in the upper and lower extremities. Cranial nerves II through XII are grossly intact. Proprioception is intact. No peripheral adenopathy or edema is identified. No motor or sensory levels are noted. Crude visual fields are within normal range.  RADIOLOGY RESULTS: PET scan has been ordered for March  PLAN: I have asked to see her back in 3 months when we will have PET/CT results to review.  Patient otherwise clinically is doing well 1 month out  from concurrent chemoradiation.  And pleased with her overall progress.  Patient knows to call with any concerns.  I would like to take this opportunity to thank you for allowing me to participate in the care of your patient.[name], MD

## 2020-10-13 ENCOUNTER — Other Ambulatory Visit: Payer: Medicare Other

## 2020-10-13 ENCOUNTER — Ambulatory Visit: Payer: Medicare Other | Admitting: Internal Medicine

## 2020-10-14 ENCOUNTER — Other Ambulatory Visit: Payer: Medicare Other

## 2020-10-14 ENCOUNTER — Ambulatory Visit: Payer: Medicare Other | Admitting: Internal Medicine

## 2020-10-31 ENCOUNTER — Telehealth: Payer: Self-pay | Admitting: *Deleted

## 2020-10-31 NOTE — Telephone Encounter (Signed)
-----   Message from Loa Socks sent at 10/31/2020 11:34 AM EST ----- Regarding: PET AUTH PENDING Please cancel PET, Auth pending. TY

## 2020-10-31 NOTE — Telephone Encounter (Signed)
[facility]lette contacted the patient. Patient informed that pet scan was cnl due to needing PA. Patient will be kept informed of when the scan is approved.

## 2020-11-03 NOTE — Telephone Encounter (Signed)
Pet scan will be r/s

## 2020-11-03 NOTE — Telephone Encounter (Signed)
PET Approved and ready for r/s. Per [facility] Auth# [id number] valid 11/02/20-12/17/20 for [id number]

## 2020-11-04 ENCOUNTER — Other Ambulatory Visit: Payer: Self-pay

## 2020-11-04 ENCOUNTER — Ambulatory Visit
Admission: RE | Admit: 2020-11-04 | Discharge: 2020-11-04 | Disposition: A | Discharge status: Home / Self Care | Payer: Medicare Other | Source: Ambulatory Visit | Attending: Internal Medicine | Admitting: Internal Medicine

## 2020-11-04 DIAGNOSIS — Z5181 Encounter for therapeutic drug level monitoring: Secondary | ICD-10-CM | POA: Insufficient documentation

## 2020-11-04 DIAGNOSIS — Z79899 Other long term (current) drug therapy: Secondary | ICD-10-CM | POA: Diagnosis not present

## 2020-11-04 DIAGNOSIS — C210 Malignant neoplasm of anus, unspecified: Secondary | ICD-10-CM | POA: Insufficient documentation

## 2020-11-04 DIAGNOSIS — I700 Atherosclerosis of aorta: Secondary | ICD-10-CM | POA: Insufficient documentation

## 2020-11-04 LAB — GLUCOSE, CAPILLARY: Glucose-Capillary: 87 mg/dL (ref 70–99)

## 2020-11-04 MED ORDER — FLUDEOXYGLUCOSE F - 18 (FDG) INJECTION
9.8000 | Freq: Once | INTRAVENOUS | Status: AC | PRN
  Administered 2020-11-04: 10.196 via INTRAVENOUS

## 2020-11-05 ENCOUNTER — Other Ambulatory Visit: Payer: Self-pay | Admitting: Internal Medicine

## 2020-11-11 ENCOUNTER — Inpatient Hospital Stay: Payer: Medicare Other | Attending: Internal Medicine | Admitting: Internal Medicine

## 2020-11-11 ENCOUNTER — Other Ambulatory Visit: Payer: Medicare Other

## 2020-11-11 ENCOUNTER — Encounter: Payer: Self-pay | Admitting: Internal Medicine

## 2020-11-11 ENCOUNTER — Ambulatory Visit: Payer: Medicare Other | Admitting: Internal Medicine

## 2020-11-11 ENCOUNTER — Inpatient Hospital Stay: Payer: Medicare Other

## 2020-11-11 DIAGNOSIS — M2550 Pain in unspecified joint: Secondary | ICD-10-CM | POA: Insufficient documentation

## 2020-11-11 DIAGNOSIS — Z9221 Personal history of antineoplastic chemotherapy: Secondary | ICD-10-CM | POA: Insufficient documentation

## 2020-11-11 DIAGNOSIS — G8929 Other chronic pain: Secondary | ICD-10-CM | POA: Insufficient documentation

## 2020-11-11 DIAGNOSIS — M549 Dorsalgia, unspecified: Secondary | ICD-10-CM | POA: Diagnosis not present

## 2020-11-11 DIAGNOSIS — Z923 Personal history of irradiation: Secondary | ICD-10-CM | POA: Insufficient documentation

## 2020-11-11 DIAGNOSIS — C210 Malignant neoplasm of anus, unspecified: Secondary | ICD-10-CM

## 2020-11-11 DIAGNOSIS — Z95828 Presence of other vascular implants and grafts: Secondary | ICD-10-CM

## 2020-11-11 LAB — COMPREHENSIVE METABOLIC PANEL
ALT: 15 U/L (ref 0–44)
AST: 26 U/L (ref 15–41)
Albumin: 3.9 g/dL (ref 3.5–5.0)
Alkaline Phosphatase: 69 U/L (ref 38–126)
Anion gap: 11 (ref 5–15)
BUN: 13 mg/dL (ref 8–23)
CO2: 23 mmol/L (ref 22–32)
Calcium: 8.8 mg/dL — ABNORMAL LOW (ref 8.9–10.3)
Chloride: 105 mmol/L (ref 98–111)
Creatinine, Ser: 0.72 mg/dL (ref 0.44–1.00)
GFR, Estimated: >60 mL/min (ref >60)
Glucose, Bld: 99 mg/dL (ref 70–99)
Potassium: 4 mmol/L (ref 3.5–5.1)
Sodium: 139 mmol/L (ref 135–145)
Total Bilirubin: 0.7 mg/dL (ref 0.3–1.2)
Total Protein: 6.7 g/dL (ref 6.5–8.1)

## 2020-11-11 LAB — CBC WITH DIFFERENTIAL/PLATELET
Abs Immature Granulocytes: 0.03 10*3/uL (ref 0.00–0.07)
Basophils Absolute: 0 10*3/uL (ref 0.0–0.1)
Basophils Relative: 1 %
Eosinophils Absolute: 0.1 10*3/uL (ref 0.0–0.5)
Eosinophils Relative: 2 %
HCT: 35 % — ABNORMAL LOW (ref 36.0–46.0)
Hemoglobin: 11.6 g/dL — ABNORMAL LOW (ref 12.0–15.0)
Immature Granulocytes: 1 %
Lymphocytes Relative: 9 %
Lymphs Abs: 0.5 10*3/uL — ABNORMAL LOW (ref 0.7–4.0)
MCH: 33.8 pg (ref 26.0–34.0)
MCHC: 33.1 g/dL (ref 30.0–36.0)
MCV: 102 fL — ABNORMAL HIGH (ref 80.0–100.0)
Monocytes Absolute: 0.7 10*3/uL (ref 0.1–1.0)
Monocytes Relative: 12 %
Neutro Abs: 4.2 10*3/uL (ref 1.7–7.7)
Neutrophils Relative %: 75 %
Platelets: 238 10*3/uL (ref 150–400)
RBC: 3.43 MIL/uL — ABNORMAL LOW (ref 3.87–5.11)
RDW: 12.9 % (ref 11.5–15.5)
WBC: 5.6 10*3/uL (ref 4.0–10.5)
nRBC: 0 % (ref 0.0–0.2)

## 2020-11-11 MED ORDER — HEPARIN SOD (PORK) LOCK FLUSH 100 UNIT/ML IV SOLN
500.0000 [IU] | Freq: Once | INTRAVENOUS | Status: AC
  Administered 2020-11-11: 500 [IU] via INTRAVENOUS
  Filled 2020-11-11: qty 5

## 2020-11-11 MED ORDER — SODIUM CHLORIDE 0.9% FLUSH
10.0000 mL | Freq: Once | INTRAVENOUS | Status: AC
  Administered 2020-11-11: 10 mL via INTRAVENOUS
  Filled 2020-11-11: qty 10
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
## 2020-11-11 NOTE — Assessment & Plan Note (Addendum)
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  MARCH 8th, 2022- PET - Nonspecific residual mild anal hypermetabolism, decreased, which could represent post treatment change or residual malignancy. No hypermetabolic locoregional or distant metastatic disease.  #Reviewed with the patient the findings as noted on the imaging-clinically most suggestive of treatment response rather than residual malignancy.  However recommend reevaluation with surgery-in approximately 1-2 months.  Discussed at times it may take up to 6 months for the disease to completely resolve.  Discussed with Dr. Bary Castilla.  # Eye injection-not related to patient's recent chemo/radiation therapy.  Recommend continued hydration lotion.  # diarrhea/constipation/blood in stools-improved with Metamucil. Marland Kitchen  #Chronic back pain/joint pain-STABLE.   #Mediport in place-will await confirmation of complete response-and then plan explantation.  Patient agreement.  # DISPOSITION: # follow up in 61months- MD;labs;cbc/cmp/port flush- Dr.B  # I reviewed the blood work- with the patient in detail; also reviewed the imaging independently [as summarized above]; and with the patient in detail.

## 2020-11-11 NOTE — Progress Notes (Signed)
Patient here for results she reports new onset of bloodshot eye.

## 2020-12-22 ENCOUNTER — Encounter: Payer: Self-pay | Admitting: Radiation Oncology

## 2020-12-22 ENCOUNTER — Ambulatory Visit
Admission: RE | Admit: 2020-12-22 | Discharge: 2020-12-22 | Disposition: A | Discharge status: Home / Self Care | Payer: Medicare Other | Source: Ambulatory Visit | Attending: Radiation Oncology | Admitting: Radiation Oncology

## 2020-12-22 VITALS — BP 161/85 | HR 81 | Temp 96.8°F | Resp 16 | Wt 191.5 lb

## 2020-12-22 DIAGNOSIS — Z923 Personal history of irradiation: Secondary | ICD-10-CM | POA: Insufficient documentation

## 2020-12-22 DIAGNOSIS — Z9221 Personal history of antineoplastic chemotherapy: Secondary | ICD-10-CM | POA: Insufficient documentation

## 2020-12-22 DIAGNOSIS — C210 Malignant neoplasm of anus, unspecified: Secondary | ICD-10-CM | POA: Diagnosis not present

## 2020-12-22 NOTE — Progress Notes (Signed)
Radiation Oncology Follow up Note  Name: [name]   Date:   12/22/2020 MRN:  [id number] DOB: 1951/02/28    This 70 y.o. female presents to the clinic today for 3-month follow-up status post concurrent chemoradiation therapy for squamous cell carcinoma the anus.  REFERRING PROVIDER: [name], MD  HPI: Patient is a 70 year old female now at 4 months having completed concurrent chemoradiation therapy for squamous cell carcinoma the anus seen today in routine follow-up she is doing well she specifically denies any rectal bleeding occasionally does have some slight pain she does take laxatives for constipation.  She had a recent PET CT scan.  Which I have reviewed showed nonspecific mild anal hypermetabolic activity.  She had an anoscopy last week by Dr. Tollie Pizza showing no evidence of disease  COMPLICATIONS OF TREATMENT: none  FOLLOW UP COMPLIANCE: keeps appointments   PHYSICAL EXAM:  BP (!) 161/85 (BP Location: Left Arm, Patient Position: Sitting)   Pulse 81   Temp (!) 96.8 F (36 C) (Tympanic)   Resp 16   Wt 191 lb 8 oz (86.9 kg)   BMI 32.87 kg/m  Well-developed well-nourished patient in NAD. HEENT reveals PERLA, EOMI, discs not visualized.  Oral cavity is clear. No oral mucosal lesions are identified. Neck is clear without evidence of cervical or supraclavicular adenopathy. Lungs are clear to A&P. Cardiac examination is essentially unremarkable with regular rate and rhythm without murmur rub or thrill. Abdomen is benign with no organomegaly or masses noted. Motor sensory and DTR levels are equal and symmetric in the upper and lower extremities. Cranial nerves II through XII are grossly intact. Proprioception is intact. No peripheral adenopathy or edema is identified. No motor or sensory levels are noted. Crude visual fields are within normal range.  RADIOLOGY RESULTS: PET CT scan reviewed compatible with above-stated findings  PLAN: Present time I am not convinced there is even  significant hype residual minor hypermetabolic activity in the anal region this may be just resolving inflammation.  I have asked to see her back in 6 months for follow-up.  She continues close follow-up care and exam and examination by Dr. Tollie Pizza.  Patient knows to call with any concerns.  I would like to take this opportunity to thank you for allowing me to participate in the care of your patient.[name], MD

## 2021-02-10 ENCOUNTER — Inpatient Hospital Stay: Payer: Medicare Other | Attending: Internal Medicine

## 2021-02-10 ENCOUNTER — Encounter: Payer: Self-pay | Admitting: Internal Medicine

## 2021-02-10 ENCOUNTER — Inpatient Hospital Stay (HOSPITAL_BASED_OUTPATIENT_CLINIC_OR_DEPARTMENT_OTHER): Payer: Medicare Other | Admitting: Internal Medicine

## 2021-02-10 ENCOUNTER — Other Ambulatory Visit: Payer: Self-pay

## 2021-02-10 DIAGNOSIS — Z803 Family history of malignant neoplasm of breast: Secondary | ICD-10-CM | POA: Insufficient documentation

## 2021-02-10 DIAGNOSIS — E039 Hypothyroidism, unspecified: Secondary | ICD-10-CM | POA: Diagnosis not present

## 2021-02-10 DIAGNOSIS — M549 Dorsalgia, unspecified: Secondary | ICD-10-CM | POA: Insufficient documentation

## 2021-02-10 DIAGNOSIS — C210 Malignant neoplasm of anus, unspecified: Secondary | ICD-10-CM | POA: Diagnosis not present

## 2021-02-10 DIAGNOSIS — I10 Essential (primary) hypertension: Secondary | ICD-10-CM | POA: Diagnosis not present

## 2021-02-10 DIAGNOSIS — Z96652 Presence of left artificial knee joint: Secondary | ICD-10-CM | POA: Insufficient documentation

## 2021-02-10 DIAGNOSIS — K5900 Constipation, unspecified: Secondary | ICD-10-CM | POA: Diagnosis not present

## 2021-02-10 DIAGNOSIS — Z95828 Presence of other vascular implants and grafts: Secondary | ICD-10-CM

## 2021-02-10 LAB — COMPREHENSIVE METABOLIC PANEL
ALT: 19 U/L (ref 0–44)
AST: 28 U/L (ref 15–41)
Albumin: 4.1 g/dL (ref 3.5–5.0)
Alkaline Phosphatase: 72 U/L (ref 38–126)
Anion gap: 12 (ref 5–15)
BUN: 14 mg/dL (ref 8–23)
CO2: 25 mmol/L (ref 22–32)
Calcium: 9.2 mg/dL (ref 8.9–10.3)
Chloride: 100 mmol/L (ref 98–111)
Creatinine, Ser: 0.84 mg/dL (ref 0.44–1.00)
GFR, Estimated: >60 mL/min (ref >60)
Glucose, Bld: 112 mg/dL — ABNORMAL HIGH (ref 70–99)
Potassium: 3.8 mmol/L (ref 3.5–5.1)
Sodium: 137 mmol/L (ref 135–145)
Total Bilirubin: 0.5 mg/dL (ref 0.3–1.2)
Total Protein: 7.3 g/dL (ref 6.5–8.1)

## 2021-02-10 LAB — CBC WITH DIFFERENTIAL/PLATELET
Abs Immature Granulocytes: 0.04 10*3/uL (ref 0.00–0.07)
Basophils Absolute: 0 10*3/uL (ref 0.0–0.1)
Basophils Relative: 1 %
Eosinophils Absolute: 0.1 10*3/uL (ref 0.0–0.5)
Eosinophils Relative: 1 %
HCT: 36 % (ref 36.0–46.0)
Hemoglobin: 12.4 g/dL (ref 12.0–15.0)
Immature Granulocytes: 1 %
Lymphocytes Relative: 12 %
Lymphs Abs: 0.7 10*3/uL (ref 0.7–4.0)
MCH: 32.2 pg (ref 26.0–34.0)
MCHC: 34.4 g/dL (ref 30.0–36.0)
MCV: 93.5 fL (ref 80.0–100.0)
Monocytes Absolute: 0.6 10*3/uL (ref 0.1–1.0)
Monocytes Relative: 10 %
Neutro Abs: 4.4 10*3/uL (ref 1.7–7.7)
Neutrophils Relative %: 75 %
Platelets: 249 10*3/uL (ref 150–400)
RBC: 3.85 MIL/uL — ABNORMAL LOW (ref 3.87–5.11)
RDW: 13 % (ref 11.5–15.5)
WBC: 5.8 10*3/uL (ref 4.0–10.5)
nRBC: 0 % (ref 0.0–0.2)

## 2021-02-10 MED ORDER — HEPARIN SOD (PORK) LOCK FLUSH 100 UNIT/ML IV SOLN
INTRAVENOUS | Status: AC
  Filled 2021-02-10: qty 5

## 2021-02-10 MED ORDER — HEPARIN SOD (PORK) LOCK FLUSH 100 UNIT/ML IV SOLN
500.0000 [IU] | Freq: Once | INTRAVENOUS | Status: AC
  Administered 2021-02-10: 500 [IU] via INTRAVENOUS
  Filled 2021-02-10: qty 5

## 2021-02-10 MED ORDER — SODIUM CHLORIDE 0.9% FLUSH
10.0000 mL | INTRAVENOUS | Status: DC | PRN
  Administered 2021-02-10: 10 mL via INTRAVENOUS
  Filled 2021-02-10: qty 10

## 2021-02-10 NOTE — Assessment & Plan Note (Addendum)
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  MARCH 8th, 2022- PET - Nonspecific residual mild anal hypermetabolism, decreased, which could represent post treatment change or residual malignancy. No hypermetabolic locoregional or distant metastatic disease.  April 2022- s/p anoscopy- [Dr.[name]]-likely residual granulation tissue.  Patient awaiting repeat follow-up in the next couple weeks.  # left sided back pain-likely musculoskeletal-plan topical NSAIDs.  #Constipation-likely functional recommend increase dietary fiber; MiraLAX as needed  #Mediport in place-await repeat evaluation with Dr. Bary Castilla ; if negative for malignancy can have port taken out.   # DISPOSITION: # follow up in 6 month- MD;labs;cbc/cmp- Dr.B  Addendum: Discussed with Dr. Marvell Fuller clinical exam negative for any malignancy.  Discussed with Dr. Bary Castilla that it is reasonable to have the port taken out. GB
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
## 2021-02-19 ENCOUNTER — Encounter: Payer: Self-pay | Admitting: Internal Medicine

## 2021-02-22 ENCOUNTER — Encounter: Payer: Self-pay | Admitting: Internal Medicine

## 2021-03-13 IMAGING — CT NM PET TUM IMG INITIAL (PI) SKULL BASE T - THIGH
1 of 9 series · 1 of 25 positions shown · non-contrast
Comparison: None.

CLINICAL DATA: Initial treatment strategy for anal carcinoma.

EXAM:
NUCLEAR MEDICINE PET SKULL BASE TO THIGH
TECHNIQUE: 10.9 mCi F-18 FDG was injected intravenously. Full-ring PET imaging
was performed from the skull base to thigh after the radiotracer. CT
data was obtained and used for attenuation correction and anatomic
localization.
Fasting blood glucose: 127 mg/dl

[Series 3: ct wb 5.0 b30f · axial · 5.0mm · 0.98mm/px · 1 of 290 slices shown]
[im 290/290  brain]
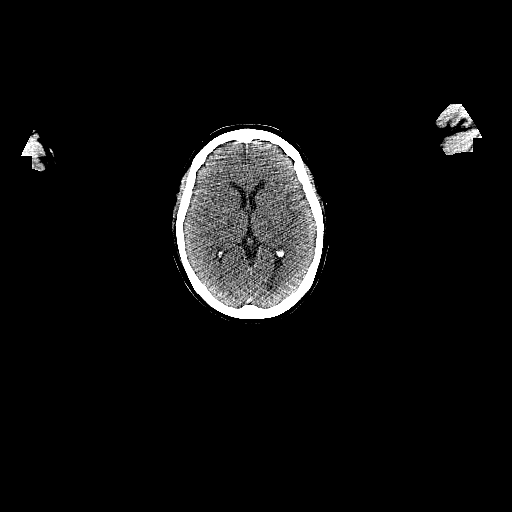

[1 of 25 positions shown; findings below may reference images not displayed]

FINDINGS: Mediastinal blood pool activity: SUV max

Liver activity: SUV max NA

NECK: No hypermetabolic lymph nodes in the neck.

Incidental CT findings: none

CHEST: No hypermetabolic mediastinal or hilar nodes. No suspicious
pulmonary nodules on the CT scan.

Incidental CT findings: none

ABDOMEN/PELVIS: There is fairly intense activity centrally within
the anal canal (SUV max equal 0.9). This is a nonspecific finding
but does correlate with history of anal carcinoma.

No hypermetabolic lymph nodes in the pelvis. No hypermetabolic
inguinal lymph nodes. No hypermetabolic abdominal retroperitoneal
nodes. No abnormal activity in liver.

There are two cystic lesions in the spleen, one with peripheral
calcification. The lesions have no metabolic activity.

Incidental CT findings: Atherosclerotic calcification of the aorta.
Postcholecystectomy

SKELETON: No focal hypermetabolic activity to suggest skeletal
metastasis.

Incidental CT findings: none
IMPRESSION: 1. Focal metabolic activity in the anal canal presumably represents
primary carcinoma.
2. No evidence of metastatic adenopathy in the pelvis or
retroperitoneum.
3. No evidence of visceral metastasis or skeletal metastasis.
4. Benign splenic cysts.

## 2021-03-16 IMAGING — DX DG CHEST 1V PORT
1 series · 1 of 1 positions shown · non-contrast
Comparison: None.
COMPARISON: None.

Addendum:
CLINICAL DATA: Status post Port-A-Cath placement

EXAM:
PORTABLE CHEST 1 VIEW

[chest ap]
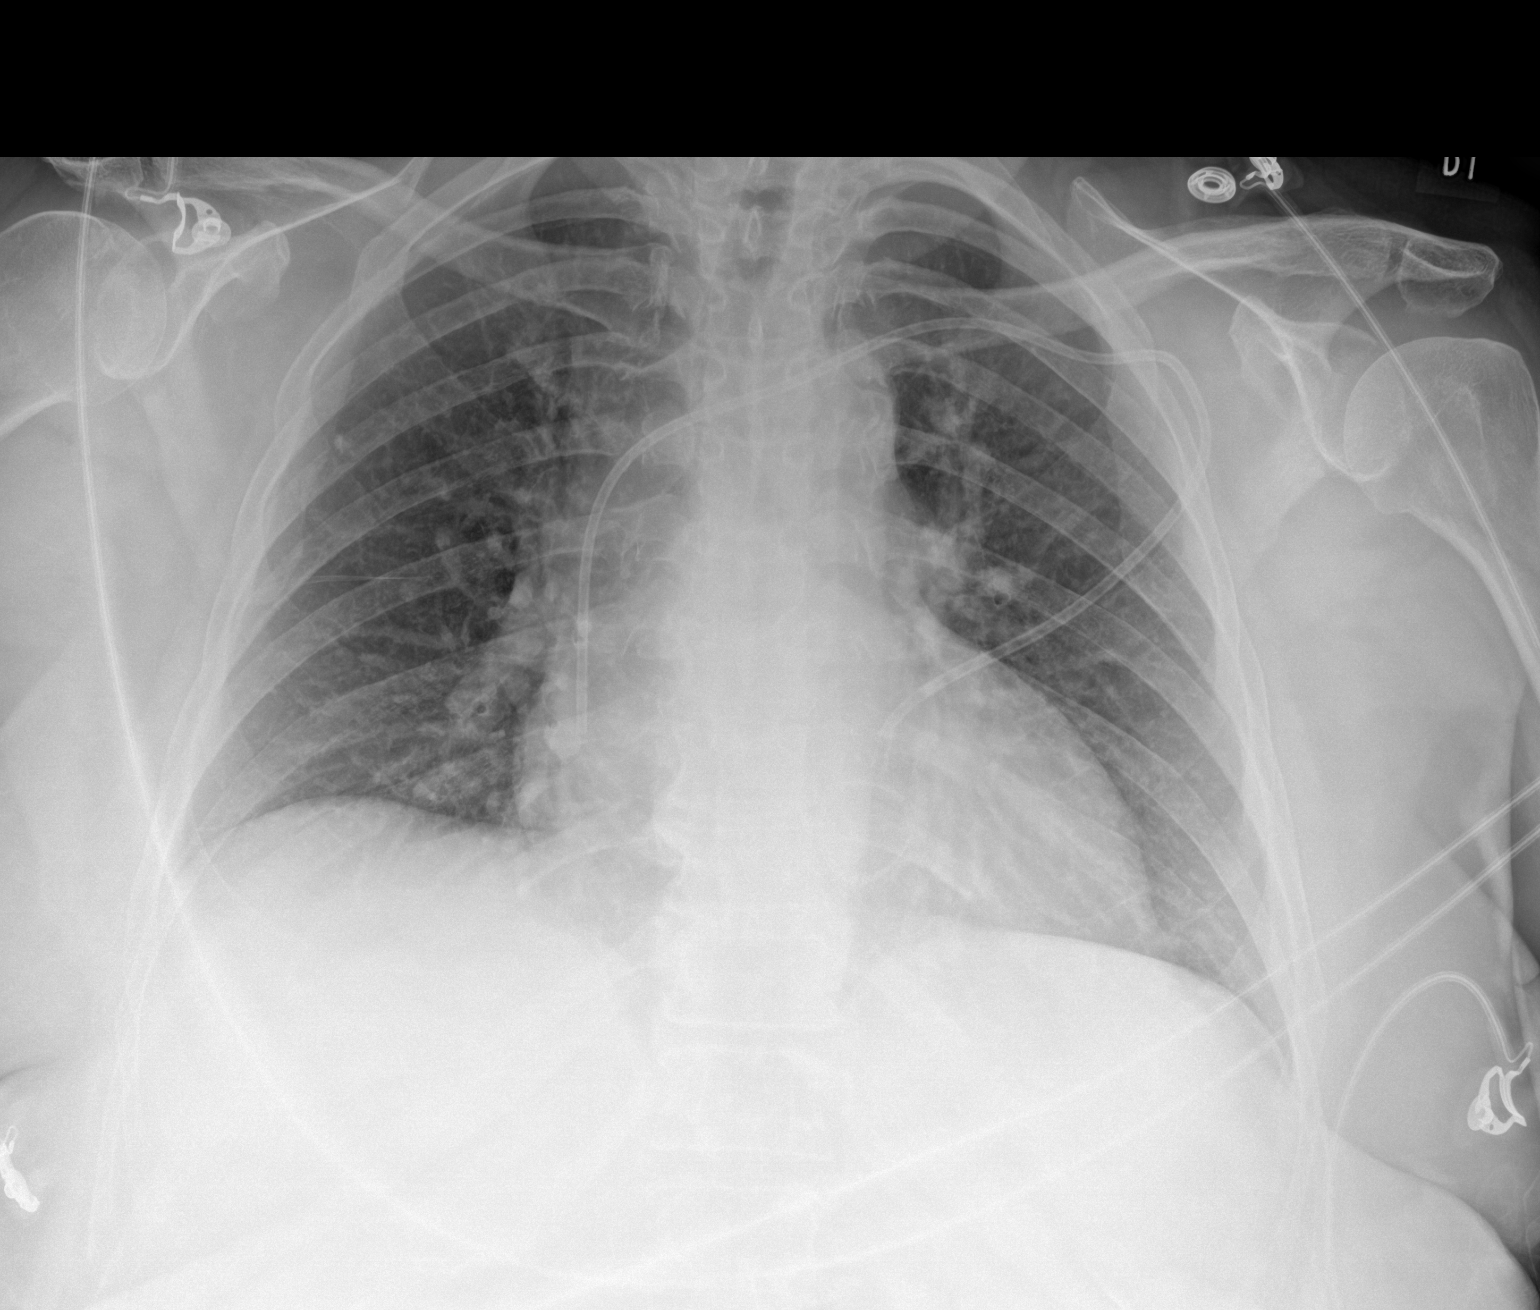

[1 of 1 positions shown; findings below may reference images not displayed]

FINDINGS: No focal consolidation. No pleural effusion or pneumothorax. Heart
and mediastinal contours are unremarkable. Right-sided Port-A-Cath
with the tip projecting over the cavoatrial junction.

No acute osseous abnormality.
IMPRESSION: Right-sided Port-A-Cath in satisfactory position.

ADDENDUM:
Left-sided Port-A-Cath with the tip projecting over the cavoatrial
junction.

*** End of Addendum ***
FINDINGS: No focal consolidation. No pleural effusion or pneumothorax. Heart
and mediastinal contours are unremarkable. Right-sided Port-A-Cath
with the tip projecting over the cavoatrial junction.

No acute osseous abnormality.
IMPRESSION: Right-sided Port-A-Cath in satisfactory position.

## 2021-06-24 ENCOUNTER — Other Ambulatory Visit: Payer: Self-pay

## 2021-06-24 ENCOUNTER — Encounter: Payer: Self-pay | Admitting: Radiation Oncology

## 2021-06-24 ENCOUNTER — Ambulatory Visit
Admission: RE | Admit: 2021-06-24 | Discharge: 2021-06-24 | Disposition: A | Discharge status: Home / Self Care | Payer: Medicare Other | Source: Ambulatory Visit | Attending: Radiation Oncology | Admitting: Radiation Oncology

## 2021-06-24 VITALS — BP 144/76 | HR 78 | Temp 78.0°F | Resp 16 | Wt 197.3 lb

## 2021-06-24 DIAGNOSIS — Z8719 Personal history of other diseases of the digestive system: Secondary | ICD-10-CM | POA: Diagnosis not present

## 2021-06-24 DIAGNOSIS — Z85048 Personal history of other malignant neoplasm of rectum, rectosigmoid junction, and anus: Secondary | ICD-10-CM | POA: Diagnosis not present

## 2021-06-24 DIAGNOSIS — Z9221 Personal history of antineoplastic chemotherapy: Secondary | ICD-10-CM | POA: Diagnosis not present

## 2021-06-24 DIAGNOSIS — Z923 Personal history of irradiation: Secondary | ICD-10-CM | POA: Insufficient documentation

## 2021-06-24 DIAGNOSIS — C210 Malignant neoplasm of anus, unspecified: Secondary | ICD-10-CM

## 2021-06-24 NOTE — Progress Notes (Signed)
Radiation Oncology Follow up Note  Name: [name]   Date:   06/24/2021 MRN:  [id number] DOB: [birth year]    This 70 y.o. female presents to the clinic today for 56-month follow-up status post concurrent chemoradiation therapy for squamous cell carcinoma the anus.  REFERRING PROVIDER: [name], MD  HPI: Patient is a 70 year old female now at 10 months having completed concurrent chemoradiation therapy for squamous cell carcinoma the anus.  Seen today in routine follow-up her only complaint is some occasional red blood per rectum.  She does have a history of hemorrhoids.  She is specifically denies any increased lower urinary tract symptoms diarrhea or fatigue..  She had a PET CT scan in March which I reviewed showed nonspecific residual mild anal hypermetabolic activity.  No local regional or distant metastatic disease noted.  COMPLICATIONS OF TREATMENT: none  FOLLOW UP COMPLIANCE: keeps appointments   PHYSICAL EXAM:  BP (!) 144/76 (BP Location: Left Arm, Patient Position: Sitting)   Pulse 78   Temp (!) 78 F (25.6 C) (Tympanic)   Resp 16   Wt 197 lb 4.8 oz (89.5 kg)   BMI 33.87 kg/m  On rectal exam anal sphincter tone is good no evidence of mass or nodularity noted no inguinal adenopathy is noted.  Well-developed well-nourished patient in NAD. HEENT reveals PERLA, EOMI, discs not visualized.  Oral cavity is clear. No oral mucosal lesions are identified. Neck is clear without evidence of cervical or supraclavicular adenopathy. Lungs are clear to A&P. Cardiac examination is essentially unremarkable with regular rate and rhythm without murmur rub or thrill. Abdomen is benign with no organomegaly or masses noted. Motor sensory and DTR levels are equal and symmetric in the upper and lower extremities. Cranial nerves II through XII are grossly intact. Proprioception is intact. No peripheral adenopathy or edema is identified. No motor or sensory levels are noted. Crude visual fields are  within normal range.  RADIOLOGY RESULTS: PET CT scan reviewed compatible with above-stated findings  PLAN: Present time patient is doing well with no evidence of disease now 10 months out from concurrent therapy for anal carcinoma.  I am pleased with her overall progress.  She continues close follow-up care with medical oncology as well as Dr. Tollie Pizza.  I have asked to see her back in 1 year for follow-up.  Patient knows to call with any concerns.  I would like to take this opportunity to thank you for allowing me to participate in the care of your patient.[name], MD

## 2021-08-12 ENCOUNTER — Other Ambulatory Visit: Payer: Medicare Other

## 2021-08-12 ENCOUNTER — Ambulatory Visit: Payer: Medicare Other | Admitting: Internal Medicine

## 2021-09-07 ENCOUNTER — Other Ambulatory Visit: Payer: Self-pay

## 2021-09-07 ENCOUNTER — Inpatient Hospital Stay: Payer: Medicare Other | Attending: Internal Medicine

## 2021-09-07 ENCOUNTER — Inpatient Hospital Stay (HOSPITAL_BASED_OUTPATIENT_CLINIC_OR_DEPARTMENT_OTHER): Payer: Medicare Other | Admitting: Internal Medicine

## 2021-09-07 ENCOUNTER — Encounter: Payer: Self-pay | Admitting: Internal Medicine

## 2021-09-07 VITALS — BP 130/78 | HR 94 | Temp 98.9°F | Wt 200.6 lb

## 2021-09-07 DIAGNOSIS — C210 Malignant neoplasm of anus, unspecified: Secondary | ICD-10-CM | POA: Insufficient documentation

## 2021-09-07 DIAGNOSIS — R195 Other fecal abnormalities: Secondary | ICD-10-CM | POA: Diagnosis not present

## 2021-09-07 DIAGNOSIS — K5900 Constipation, unspecified: Secondary | ICD-10-CM | POA: Diagnosis not present

## 2021-09-07 LAB — CBC WITH DIFFERENTIAL/PLATELET
Abs Immature Granulocytes: 0.02 10*3/uL (ref 0.00–0.07)
Basophils Absolute: 0 10*3/uL (ref 0.0–0.1)
Basophils Relative: 1 %
Eosinophils Absolute: 0.1 10*3/uL (ref 0.0–0.5)
Eosinophils Relative: 2 %
HCT: 38 % (ref 36.0–46.0)
Hemoglobin: 13.1 g/dL (ref 12.0–15.0)
Immature Granulocytes: 0 %
Lymphocytes Relative: 19 %
Lymphs Abs: 1.1 10*3/uL (ref 0.7–4.0)
MCH: 33.4 pg (ref 26.0–34.0)
MCHC: 34.5 g/dL (ref 30.0–36.0)
MCV: 96.9 fL (ref 80.0–100.0)
Monocytes Absolute: 0.7 10*3/uL (ref 0.1–1.0)
Monocytes Relative: 12 %
Neutro Abs: 3.8 10*3/uL (ref 1.7–7.7)
Neutrophils Relative %: 66 %
Platelets: 288 10*3/uL (ref 150–400)
RBC: 3.92 MIL/uL (ref 3.87–5.11)
RDW: 12.6 % (ref 11.5–15.5)
WBC: 5.7 10*3/uL (ref 4.0–10.5)
nRBC: 0 % (ref 0.0–0.2)

## 2021-09-07 LAB — COMPREHENSIVE METABOLIC PANEL
ALT: 20 U/L (ref 0–44)
AST: 29 U/L (ref 15–41)
Albumin: 4.2 g/dL (ref 3.5–5.0)
Alkaline Phosphatase: 77 U/L (ref 38–126)
Anion gap: 9 (ref 5–15)
BUN: 15 mg/dL (ref 8–23)
CO2: 26 mmol/L (ref 22–32)
Calcium: 9.1 mg/dL (ref 8.9–10.3)
Chloride: 99 mmol/L (ref 98–111)
Creatinine, Ser: 0.96 mg/dL (ref 0.44–1.00)
GFR, Estimated: >60 mL/min (ref >60)
Glucose, Bld: 94 mg/dL (ref 70–99)
Potassium: 3.8 mmol/L (ref 3.5–5.1)
Sodium: 134 mmol/L — ABNORMAL LOW (ref 135–145)
Total Bilirubin: 0.7 mg/dL (ref 0.3–1.2)
Total Protein: 7.6 g/dL (ref 6.5–8.1)
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
## 2021-09-07 NOTE — Progress Notes (Signed)
Pt states she has a sore throat and blister/nodules in throat.   Passing blood with BM, Dr. Donella Stade states hemorrhoids.

## 2021-09-07 NOTE — Assessment & Plan Note (Addendum)
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  MARCH 8th, 2022- PET - Nonspecific residual mild anal hypermetabolism, decreased, which could represent post treatment change or residual malignancy. No hypermetabolic locoregional or distant metastatic disease.  OCT 2022- s/p anoscopy- [Dr.[name]]-no evidence of any malignancy.  Stable.   #Intermittent blood in stools-likely hemorrhoidal.  If this continues to be a problem/distressing to the patient recommend evaluation with GI regarding banding.  # DISPOSITION: print copy of labs from today # follow up in 6 month- MD;labs;cbc/cmp- Dr.B

## 2022-03-08 ENCOUNTER — Inpatient Hospital Stay: Payer: Medicare Other | Admitting: Internal Medicine

## 2022-03-08 ENCOUNTER — Inpatient Hospital Stay: Payer: Medicare Other | Attending: Internal Medicine

## 2022-03-08 ENCOUNTER — Encounter: Payer: Self-pay | Admitting: Internal Medicine

## 2022-03-08 DIAGNOSIS — C210 Malignant neoplasm of anus, unspecified: Secondary | ICD-10-CM | POA: Diagnosis not present

## 2022-03-08 DIAGNOSIS — Z803 Family history of malignant neoplasm of breast: Secondary | ICD-10-CM | POA: Insufficient documentation

## 2022-03-08 DIAGNOSIS — Z85048 Personal history of other malignant neoplasm of rectum, rectosigmoid junction, and anus: Secondary | ICD-10-CM | POA: Diagnosis present

## 2022-03-08 DIAGNOSIS — K921 Melena: Secondary | ICD-10-CM | POA: Diagnosis not present

## 2022-03-08 DIAGNOSIS — E039 Hypothyroidism, unspecified: Secondary | ICD-10-CM | POA: Diagnosis not present

## 2022-03-08 DIAGNOSIS — I10 Essential (primary) hypertension: Secondary | ICD-10-CM | POA: Diagnosis not present

## 2022-03-08 DIAGNOSIS — Z780 Asymptomatic menopausal state: Secondary | ICD-10-CM | POA: Diagnosis not present

## 2022-03-08 LAB — CBC WITH DIFFERENTIAL/PLATELET
Abs Immature Granulocytes: 0.04 10*3/uL (ref 0.00–0.07)
Basophils Absolute: 0.1 10*3/uL (ref 0.0–0.1)
Basophils Relative: 1 %
Eosinophils Absolute: 0.1 10*3/uL (ref 0.0–0.5)
Eosinophils Relative: 1 %
HCT: 38.2 % (ref 36.0–46.0)
Hemoglobin: 12.7 g/dL (ref 12.0–15.0)
Immature Granulocytes: 1 %
Lymphocytes Relative: 15 %
Lymphs Abs: 1.1 10*3/uL (ref 0.7–4.0)
MCH: 32.8 pg (ref 26.0–34.0)
MCHC: 33.2 g/dL (ref 30.0–36.0)
MCV: 98.7 fL (ref 80.0–100.0)
Monocytes Absolute: 0.7 10*3/uL (ref 0.1–1.0)
Monocytes Relative: 9 %
Neutro Abs: 5.5 10*3/uL (ref 1.7–7.7)
Neutrophils Relative %: 73 %
Platelets: 258 10*3/uL (ref 150–400)
RBC: 3.87 MIL/uL (ref 3.87–5.11)
RDW: 12.5 % (ref 11.5–15.5)
WBC: 7.4 10*3/uL (ref 4.0–10.5)
nRBC: 0 % (ref 0.0–0.2)

## 2022-03-08 LAB — COMPREHENSIVE METABOLIC PANEL
ALT: 26 U/L (ref 0–44)
AST: 34 U/L (ref 15–41)
Albumin: 4 g/dL (ref 3.5–5.0)
Alkaline Phosphatase: 75 U/L (ref 38–126)
Anion gap: 8 (ref 5–15)
BUN: 14 mg/dL (ref 8–23)
CO2: 23 mmol/L (ref 22–32)
Calcium: 8.6 mg/dL — ABNORMAL LOW (ref 8.9–10.3)
Chloride: 103 mmol/L (ref 98–111)
Creatinine, Ser: 1.1 mg/dL — ABNORMAL HIGH (ref 0.44–1.00)
GFR, Estimated: 54 mL/min — ABNORMAL LOW (ref >60)
Glucose, Bld: 114 mg/dL — ABNORMAL HIGH (ref 70–99)
Potassium: 4 mmol/L (ref 3.5–5.1)
Sodium: 134 mmol/L — ABNORMAL LOW (ref 135–145)
Total Bilirubin: 0.5 mg/dL (ref 0.3–1.2)
Total Protein: 7.3 g/dL (ref 6.5–8.1)

## 2022-03-08 NOTE — Assessment & Plan Note (Addendum)
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  MARCH 8th, 2022- PET - Nonspecific residual mild anal hypermetabolism, decreased, which could represent post treatment change or residual malignancy. No hypermetabolic locoregional or distant metastatic disease. MARCH 2023- - s/p anoscopy- [Dr.[name]]-no evidence of any malignancy.  Stable.  Awaiting reavluation in SEP, 2023.   #Intermittent blood in stools-likely hemorrhoidal.  If this continues to be a problem/distressing to the patient recommend evaluation with Dr.[name] regarding banding. STABLE.  # intermittent maculo-papular lesion Bil LE- itchy- defer to PCP re: dermatology evaluation.Left shin- ? SCC   # Mild low GFR- 54- recommend continued hydration. Monitor for now.   # DISPOSITION: print copy of labs from today # follow up in mid FEB 2024 MD;labs;cbc/cmp- Dr.B

## 2022-03-08 NOTE — Progress Notes (Signed)
Patient states she has noticed some a couple different spots to show up on different spots of legs and thighs. Denies any itching or irritation.
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
## 2022-03-22 ENCOUNTER — Other Ambulatory Visit: Payer: Self-pay

## 2022-04-05 ENCOUNTER — Other Ambulatory Visit: Payer: Self-pay

## 2022-06-24 ENCOUNTER — Encounter: Payer: Self-pay | Admitting: Radiation Oncology

## 2022-06-24 ENCOUNTER — Ambulatory Visit
Admission: RE | Admit: 2022-06-24 | Discharge: 2022-06-24 | Disposition: A | Discharge status: Home / Self Care | Payer: Medicare Other | Source: Ambulatory Visit | Attending: Radiation Oncology | Admitting: Radiation Oncology

## 2022-06-24 VITALS — BP 147/80 | HR 73 | Temp 97.0°F | Resp 16 | Ht 64.0 in | Wt 204.0 lb

## 2022-06-24 DIAGNOSIS — Z85048 Personal history of other malignant neoplasm of rectum, rectosigmoid junction, and anus: Secondary | ICD-10-CM | POA: Insufficient documentation

## 2022-06-24 DIAGNOSIS — Z85828 Personal history of other malignant neoplasm of skin: Secondary | ICD-10-CM | POA: Insufficient documentation

## 2022-06-24 DIAGNOSIS — C210 Malignant neoplasm of anus, unspecified: Secondary | ICD-10-CM

## 2022-06-24 DIAGNOSIS — Z923 Personal history of irradiation: Secondary | ICD-10-CM | POA: Insufficient documentation

## 2022-06-24 NOTE — Progress Notes (Signed)
Radiation Oncology Follow up Note  Name: [name]   Date:   06/24/2022 MRN:  [id number] DOB: Oct 15, 1950    This 71 y.o. 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  Seen today in routine follow-up she is doing well.  She has had a few episodes of bright red blood per rectum when she is wiping.  This is most likely related to her hemorrhoids.  She specifically denies any pain on defecation and increased lower urinary tract symptoms..  She had a PET CT scan back in March which I reviewed showing nonspecific residual hypermetabolic activity which could resent treatment effect or residual disease.  She also has a basal cell carcinoma of the left lower extremity which has been treated by dermatology and is being followed.  COMPLICATIONS OF TREATMENT: none  FOLLOW UP COMPLIANCE: keeps appointments   PHYSICAL EXAM:  BP (!) 147/80 (BP Location: Right Arm, Patient Position: Sitting, Cuff Size: Normal)   Pulse 73   Temp (!) 97 F (36.1 C) (Tympanic)   Resp 16   Ht '5\' 4"'$  (1.626 m)   Wt 204 lb (92.5 kg)   BMI 35.02 kg/m  On rectal exam there is no rectal nodularity in the anal vault.  No inguinal adenopathy is identified.  Granulating lesion on her shin of the left lower extremity compatible with treated basal cell carcinoma.  Well-developed well-nourished patient in NAD. HEENT reveals PERLA, EOMI, discs not visualized.  Oral cavity is clear. No oral mucosal lesions are identified. Neck is clear without evidence of cervical or supraclavicular adenopathy. Lungs are clear to A&P. Cardiac examination is essentially unremarkable with regular rate and rhythm without murmur rub or thrill. Abdomen is  benign with no organomegaly or masses noted. Motor sensory and DTR levels are equal and symmetric in the upper and lower extremities. Cranial nerves II through XII are grossly intact. Proprioception is intact. No peripheral adenopathy or edema is identified. No motor or sensory levels are noted. Crude visual fields are within normal range.  RADIOLOGY RESULTS: PET CT scan reviewed compatible with above-stated findings  PLAN: Present time patient is doing well with no evidence of disease.  She will be seeing Dr. Tollie Pizza again for evaluation of her hemorrhoids.  I am sure this is related to either internal or external hemorrhoids.  She continues close follow-up care with medical oncology.  I have asked to see her back in 1 year for follow-up.  She is to forward me any imaging studies performed that time..  Patient knows to call with any concerns.  I would like to take this opportunity to thank you for allowing me to participate in the care of your patient.[name], MD

## 2022-10-01 ENCOUNTER — Ambulatory Visit
Admission: EM | Admit: 2022-10-01 | Discharge: 2022-10-01 | Disposition: A | Discharge status: Home / Self Care | Payer: Medicare Other | Attending: Nurse Practitioner | Admitting: Nurse Practitioner

## 2022-10-01 DIAGNOSIS — Z1152 Encounter for screening for COVID-19: Secondary | ICD-10-CM | POA: Diagnosis not present

## 2022-10-01 DIAGNOSIS — J029 Acute pharyngitis, unspecified: Secondary | ICD-10-CM | POA: Diagnosis present

## 2022-10-01 DIAGNOSIS — U071 COVID-19: Secondary | ICD-10-CM | POA: Insufficient documentation

## 2022-10-01 LAB — POCT RAPID STREP A (OFFICE): Rapid Strep A Screen: NEGATIVE

## 2022-10-01 MED ORDER — LIDOCAINE VISCOUS HCL 2 % MT SOLN: 5.0000 mL | OROMUCOSAL | 0 refills | Status: DC | PRN

## 2022-10-01 MED ORDER — NIRMATRELVIR/RITONAVIR (PAXLOVID) TABLET (RENAL DOSING): 2.0000 | ORAL_TABLET | Freq: Two times a day (BID) | ORAL | 0 refills | Status: DC

## 2022-10-01 MED ORDER — PSEUDOEPH-BROMPHEN-DM 30-2-10 MG/5ML PO SYRP: 5.0000 mL | ORAL_SOLUTION | Freq: Four times a day (QID) | ORAL | 0 refills | Status: DC | PRN

## 2022-10-01 MED ORDER — MOLNUPIRAVIR EUA 200MG CAPSULE: 4.0000 | ORAL_CAPSULE | Freq: Two times a day (BID) | ORAL | 0 refills | Status: DC
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
## 2022-10-01 NOTE — ED Triage Notes (Signed)
Pt reports with a sore throat, sneezing, and coughing x 3 days

## 2022-10-01 NOTE — Discharge Instructions (Addendum)
COVID test is pending.  As discussed, you will be contacted if the pending test result is positive.  If the test result is positive, a prescription for Paxlovid has been sent to your preferred pharmacy.  If you start the medication, you will need to hold your simvastatin (cholesterol medication) for 2 weeks during and after taking the medication.  The rapid strep test was negative. Take medication as prescribed. Increase fluids and allow for plenty of rest. May take over-the-counter Tylenol as needed for pain, fever, or general discomfort. Recommend using a humidifier in your bedroom at nighttime during sleep and sleeping elevated on pillows while cough symptoms persist. Warm salt water gargles 3-4 times daily while throat pain persist. Recommend a soft diet to include soup, broth, yogurt, pudding, or Jell-O while your symptoms persist. Once you have completed the Paxlovid, if you no longer have symptoms, you do not need to wear a mask.  If you continue to experience symptoms after completing Paxlovid, continue to wear your mask for an additional 5 days. Isolation is recommended until you have completed the 5-day COVID treatment. Please follow-up with your primary care physician within the next week for reevaluation. Go to the emergency department if you experience difficulty breathing, shortness of breath, or become unable to speak in a complete sentence. Follow-up as needed.

## 2022-10-02 ENCOUNTER — Telehealth: Payer: Self-pay

## 2022-10-02 LAB — SARS CORONAVIRUS 2 (TAT 6-24 HRS): SARS Coronavirus 2: POSITIVE — AB

## 2022-10-02 MED ORDER — NIRMATRELVIR/RITONAVIR (PAXLOVID) TABLET (RENAL DOSING): 2.0000 | ORAL_TABLET | Freq: Two times a day (BID) | ORAL | 0 refills | Status: AC

## 2022-10-02 NOTE — Telephone Encounter (Signed)
Pt called and had some concerns about her covid medications. The pharmacy gave her mulpinivir

## 2022-10-02 NOTE — Telephone Encounter (Signed)
Pt called and had questions about the Covid meds she received. The provided discontinued the mulpirnivir, but the pharmacy still filled it not filling the correct one or calling to confirm the order. Pt picked up medication and was confused on why there were so many pills. I explained to the patient that that was the wrong medication and to call the pharmacy to see if they have the correct me the paxlovid. Pt called back and said they dont have the renal dose so the pharmacy found out that [facility] the medication. I [facility] the [facility] [place] and pt understands where to pick up her medication.

## 2022-10-03 ENCOUNTER — Telehealth: Payer: Self-pay | Admitting: Emergency Medicine

## 2022-10-03 DIAGNOSIS — J029 Acute pharyngitis, unspecified: Secondary | ICD-10-CM

## 2022-10-03 NOTE — Telephone Encounter (Signed)
Lab called and stated they do not have an order for a throat culture.  Order placed.

## 2022-10-04 ENCOUNTER — Telehealth: Payer: Self-pay

## 2022-10-04 DIAGNOSIS — J020 Streptococcal pharyngitis: Secondary | ICD-10-CM

## 2022-10-04 DIAGNOSIS — R070 Pain in throat: Secondary | ICD-10-CM

## 2022-10-11 ENCOUNTER — Inpatient Hospital Stay: Payer: Medicare Other

## 2022-10-11 ENCOUNTER — Inpatient Hospital Stay: Payer: Medicare Other | Admitting: Internal Medicine

## 2022-11-01 ENCOUNTER — Encounter: Payer: Self-pay | Admitting: Internal Medicine

## 2022-11-01 ENCOUNTER — Inpatient Hospital Stay: Payer: Medicare Other

## 2022-11-01 ENCOUNTER — Inpatient Hospital Stay: Payer: Medicare Other | Attending: Internal Medicine | Admitting: Internal Medicine

## 2022-11-01 VITALS — BP 152/91 | HR 96 | Temp 96.7°F | Resp 16 | Wt 202.2 lb

## 2022-11-01 DIAGNOSIS — C210 Malignant neoplasm of anus, unspecified: Secondary | ICD-10-CM

## 2022-11-01 DIAGNOSIS — R220 Localized swelling, mass and lump, head: Secondary | ICD-10-CM | POA: Insufficient documentation

## 2022-11-01 DIAGNOSIS — K921 Melena: Secondary | ICD-10-CM | POA: Diagnosis not present

## 2022-11-01 DIAGNOSIS — E039 Hypothyroidism, unspecified: Secondary | ICD-10-CM | POA: Insufficient documentation

## 2022-11-01 DIAGNOSIS — I10 Essential (primary) hypertension: Secondary | ICD-10-CM | POA: Diagnosis not present

## 2022-11-01 DIAGNOSIS — E559 Vitamin D deficiency, unspecified: Secondary | ICD-10-CM | POA: Diagnosis not present

## 2022-11-01 DIAGNOSIS — Z85048 Personal history of other malignant neoplasm of rectum, rectosigmoid junction, and anus: Secondary | ICD-10-CM | POA: Insufficient documentation

## 2022-11-01 LAB — COMPREHENSIVE METABOLIC PANEL
ALT: 23 U/L (ref 0–44)
AST: 29 U/L (ref 15–41)
Albumin: 4.2 g/dL (ref 3.5–5.0)
Alkaline Phosphatase: 79 U/L (ref 38–126)
Anion gap: 9 (ref 5–15)
BUN: 14 mg/dL (ref 8–23)
CO2: 26 mmol/L (ref 22–32)
Calcium: 9.4 mg/dL (ref 8.9–10.3)
Chloride: 102 mmol/L (ref 98–111)
Creatinine, Ser: 0.97 mg/dL (ref 0.44–1.00)
GFR, Estimated: >60 mL/min (ref >60)
Glucose, Bld: 117 mg/dL — ABNORMAL HIGH (ref 70–99)
Potassium: 3.9 mmol/L (ref 3.5–5.1)
Sodium: 137 mmol/L (ref 135–145)
Total Bilirubin: 0.4 mg/dL (ref 0.3–1.2)
Total Protein: 7.8 g/dL (ref 6.5–8.1)

## 2022-11-01 LAB — CBC WITH DIFFERENTIAL/PLATELET
Abs Immature Granulocytes: 0.05 10*3/uL (ref 0.00–0.07)
Basophils Absolute: 0.1 10*3/uL (ref 0.0–0.1)
Basophils Relative: 1 %
Eosinophils Absolute: 0.1 10*3/uL (ref 0.0–0.5)
Eosinophils Relative: 2 %
HCT: 39.4 % (ref 36.0–46.0)
Hemoglobin: 13 g/dL (ref 12.0–15.0)
Immature Granulocytes: 1 %
Lymphocytes Relative: 21 %
Lymphs Abs: 1.4 10*3/uL (ref 0.7–4.0)
MCH: 32 pg (ref 26.0–34.0)
MCHC: 33 g/dL (ref 30.0–36.0)
MCV: 97 fL (ref 80.0–100.0)
Monocytes Absolute: 0.6 10*3/uL (ref 0.1–1.0)
Monocytes Relative: 8 %
Neutro Abs: 4.6 10*3/uL (ref 1.7–7.7)
Neutrophils Relative %: 67 %
Platelets: 293 10*3/uL (ref 150–400)
RBC: 4.06 MIL/uL (ref 3.87–5.11)
RDW: 12.7 % (ref 11.5–15.5)
WBC: 6.7 10*3/uL (ref 4.0–10.5)
nRBC: 0 % (ref 0.0–0.2)

## 2022-11-01 NOTE — Progress Notes (Signed)
Patient states she is still having some blood in her stool, and a lump on the left side of her head (not from a fall). Recent labs drawn at [facility] in [place].

## 2022-11-01 NOTE — Assessment & Plan Note (Signed)
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  MARCH 8th, 2022- PET - Nonspecific residual mild anal hypermetabolism, decreased, which could represent post treatment change or residual malignancy. No hypermetabolic locoregional or distant metastatic disease. SEP 2023- - s/p anoscopy- [Dr.[name]]-no evidence of any malignancy.  Internal hemorrhoids without evidence of bleeding. No evidence of recurrent cancer. Awaiting evaluation- with Dr.Sakai in march, 2024.   #Intermittent blood in stools-likely hemorrhoidal.  If this continues to be a problem/distressing to the patient recommend evaluation with Dr.Sakai regarding banding. Stable.   # left scalp lump- ? Sebcecous cyst- awaiting dermatology appt.   # vit D Def [PCP]- on vitD supp.   # DISPOSITION: print copy of labs from today  # follow up in 6 months - MD: labs;cbc/cmp- Dr.B
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
## 2023-01-05 ENCOUNTER — Other Ambulatory Visit: Payer: Self-pay | Admitting: Hematology and Oncology

## 2023-05-03 NOTE — Assessment & Plan Note (Addendum)
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  MARCH 8th, 2022- PET - Nonspecific residual mild anal hypermetabolism, decreased, which could represent post treatment change or residual malignancy. No hypermetabolic locoregional or distant metastatic disease. SEP 2023- - s/p anoscopy- [Dr.[name]]-no evidence of any malignancy.  Internal hemorrhoids without evidence of bleeding. No evidence of recurrent cancer.  # Patient is due for an annual surveillance imaging.  Ordered CT scan chest and pelvis.  # Intermittent blood in stools-likely hemorrhoidal- s/p evaluation with  Dr.Sakai- recommend colo- reminded to get colo ASAP.   # left Evette Cristal- s/p dermatology evaluation- ? Pre-cancer- continue to f/u with dermatology [Dr.Eden]  # Right jaw pain- ? Last 3-4 months- defer to dentist.  # Chronic insomnia- sec to pain/MSK-?/metallic implants in [place]-  Defer to PCP.   # vit D Def [PCP]- on vitD supp.   RN will call with results.  # DISPOSITION: print copy of labs from today # CT scan CAP in 2 weeks-   # follow up in 6 months - MD: labs;cbc/cmp- Dr.B  # 25 minutes face-to-face with the patient discussing the above plan of care; more than 50% of time spent on prognosis/ natural history; counseling and coordination.
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
## 2023-05-04 ENCOUNTER — Inpatient Hospital Stay: Payer: Medicare Other | Admitting: Internal Medicine

## 2023-05-04 ENCOUNTER — Inpatient Hospital Stay: Payer: Medicare Other | Attending: Internal Medicine

## 2023-05-04 ENCOUNTER — Encounter: Payer: Self-pay | Admitting: Internal Medicine

## 2023-05-04 VITALS — BP 141/84 | HR 102 | Temp 97.8°F | Ht 64.0 in | Wt 202.8 lb

## 2023-05-04 DIAGNOSIS — Z85048 Personal history of other malignant neoplasm of rectum, rectosigmoid junction, and anus: Secondary | ICD-10-CM | POA: Insufficient documentation

## 2023-05-04 DIAGNOSIS — C210 Malignant neoplasm of anus, unspecified: Secondary | ICD-10-CM

## 2023-05-04 LAB — CBC WITH DIFFERENTIAL (CANCER CENTER ONLY)
Abs Immature Granulocytes: 0.04 10*3/uL (ref 0.00–0.07)
Basophils Absolute: 0.1 10*3/uL (ref 0.0–0.1)
Basophils Relative: 1 %
Eosinophils Absolute: 0.1 10*3/uL (ref 0.0–0.5)
Eosinophils Relative: 1 %
HCT: 38.4 % (ref 36.0–46.0)
Hemoglobin: 12.8 g/dL (ref 12.0–15.0)
Immature Granulocytes: 1 %
Lymphocytes Relative: 18 %
Lymphs Abs: 1.3 10*3/uL (ref 0.7–4.0)
MCH: 32.4 pg (ref 26.0–34.0)
MCHC: 33.3 g/dL (ref 30.0–36.0)
MCV: 97.2 fL (ref 80.0–100.0)
Monocytes Absolute: 0.7 10*3/uL (ref 0.1–1.0)
Monocytes Relative: 9 %
Neutro Abs: 5.2 10*3/uL (ref 1.7–7.7)
Neutrophils Relative %: 70 %
Platelet Count: 270 10*3/uL (ref 150–400)
RBC: 3.95 MIL/uL (ref 3.87–5.11)
RDW: 12.1 % (ref 11.5–15.5)
WBC Count: 7.4 10*3/uL (ref 4.0–10.5)
nRBC: 0 % (ref 0.0–0.2)

## 2023-05-04 LAB — CMP (CANCER CENTER ONLY)
ALT: 24 U/L (ref 0–44)
AST: 30 U/L (ref 15–41)
Albumin: 4.2 g/dL (ref 3.5–5.0)
Alkaline Phosphatase: 73 U/L (ref 38–126)
Anion gap: 11 (ref 5–15)
BUN: 14 mg/dL (ref 8–23)
CO2: 23 mmol/L (ref 22–32)
Calcium: 9.4 mg/dL (ref 8.9–10.3)
Chloride: 100 mmol/L (ref 98–111)
Creatinine: 0.99 mg/dL (ref 0.44–1.00)
GFR, Estimated: >60 mL/min (ref >60)
Glucose, Bld: 117 mg/dL — ABNORMAL HIGH (ref 70–99)
Potassium: 3.9 mmol/L (ref 3.5–5.1)
Sodium: 134 mmol/L — ABNORMAL LOW (ref 135–145)
Total Bilirubin: 0.6 mg/dL (ref 0.3–1.2)
Total Protein: 7.4 g/dL (ref 6.5–8.1)

## 2023-05-04 NOTE — Progress Notes (Signed)
Had a biopsy on left shin, pre-cancer.  Trouble sleeping, no sleep aid.  Does Voltaren gel effect blood work?  C/o facial/tongue swelling, right side, since May.  Constipation, using miralax.

## 2023-05-18 ENCOUNTER — Ambulatory Visit
Admission: RE | Admit: 2023-05-18 | Discharge: 2023-05-18 | Disposition: A | Discharge status: Home / Self Care | Payer: Medicare Other | Source: Ambulatory Visit | Attending: Internal Medicine | Admitting: Internal Medicine

## 2023-05-18 DIAGNOSIS — C210 Malignant neoplasm of anus, unspecified: Secondary | ICD-10-CM | POA: Diagnosis present

## 2023-05-18 MED ORDER — IOHEXOL 300 MG/ML  SOLN
100.0000 mL | Freq: Once | INTRAMUSCULAR | Status: AC | PRN
  Administered 2023-05-18: 100 mL via INTRAVENOUS

## 2023-05-18 MED ORDER — BARIUM SULFATE 2 % PO SUSP
450.0000 mL | ORAL | Status: AC
  Administered 2023-05-18 (×2): 450 mL via ORAL

## 2023-06-23 ENCOUNTER — Encounter: Payer: Self-pay | Admitting: Radiation Oncology

## 2023-06-23 ENCOUNTER — Ambulatory Visit
Admission: RE | Admit: 2023-06-23 | Discharge: 2023-06-23 | Disposition: A | Discharge status: Home / Self Care | Payer: Medicare Other | Source: Ambulatory Visit | Attending: Radiation Oncology | Admitting: Radiation Oncology

## 2023-06-23 VITALS — BP 153/84 | HR 77 | Temp 97.8°F | Resp 14 | Ht 64.0 in | Wt 202.4 lb

## 2023-06-23 DIAGNOSIS — K760 Fatty (change of) liver, not elsewhere classified: Secondary | ICD-10-CM | POA: Insufficient documentation

## 2023-06-23 DIAGNOSIS — Z923 Personal history of irradiation: Secondary | ICD-10-CM | POA: Insufficient documentation

## 2023-06-23 DIAGNOSIS — C210 Malignant neoplasm of anus, unspecified: Secondary | ICD-10-CM

## 2023-06-23 DIAGNOSIS — Z85048 Personal history of other malignant neoplasm of rectum, rectosigmoid junction, and anus: Secondary | ICD-10-CM | POA: Insufficient documentation

## 2023-06-23 NOTE — Progress Notes (Signed)
Radiation Oncology Follow up Note  Name: [name]   Date:   06/23/2023 MRN:  [id number] DOB: 11-Jul-1951    This 72 y.o. female presents to the clinic today for 63-month follow-up status post concurrent chemoradiation therapy for.  Squamous cell carcinoma of the anus  REFERRING PROVIDER: Ignatius Specking, MD  HPI: The patient is 32 months post concurrent chemo radiation therapy for locally advanced anal carcinoma. They report rare rectal bleeding, which is likely due to hemorrhoids. Their most recent CT scan in September was clear.  No evidence of metastatic disease in chest abdomen or pelvis.  Had diffuse hepatic steatosis they have been advised by a nurse practitioner to increase water intake due to 'sluggish kidneys,' although they are unsure how to increase their already high water intake. The patient also reports a recent blood work, but the results are not discussed in detail..  COMPLICATIONS OF TREATMENT: none  FOLLOW UP COMPLIANCE: keeps appointments   PHYSICAL EXAM:  BP (!) 153/84   Pulse 77   Temp 97.8 F (36.6 C)   Resp 14   Ht 5\' 4"  (1.626 m)   Wt 202 lb 6.4 oz (91.8 kg)   BMI 34.74 kg/m  On rectal exam rectal sphincter tone is good no evidence of mass or nodularity in the anal canal is noted.  Well-developed well-nourished patient in NAD. HEENT reveals PERLA, EOMI, discs not visualized.  Oral cavity is clear. No oral mucosal lesions are identified. Neck is clear without evidence of cervical or supraclavicular adenopathy. Lungs are clear to A&P. Cardiac examination is essentially unremarkable with regular rate and rhythm without murmur rub or thrill. Abdomen is benign with no organomegaly or masses noted. Motor sensory and DTR levels are equal and symmetric in the upper and lower extremities. Cranial nerves II through XII are grossly intact. Proprioception is intact. No peripheral adenopathy or edema is identified. No motor or sensory levels are noted. Crude visual fields  are within normal range.  RADIOLOGY RESULTS: CT scans reviewed compatible with above-stated findings  PLAN: Anal Carcinoma 32 months post concurrent chemo radiation therapy. No current symptoms. Recent CT scan in September was clear. Physical exam today was normal. -Continue follow-up with medical oncologist, Dr. B, twice a year.  I will discontinue follow-up care patient knows to call with any concerns at any time.  Renal Function Patient reports being told by a nurse practitioner that her kidneys were "sluggish".  Her BUN is 14 within normal range as well as creatinine at 0.99 -Recommend patient discuss this further with medical oncologist, Dr. Leonard Schwartz, for evaluation and management as needed.    Carmina Miller, MD

## 2023-10-06 ENCOUNTER — Encounter: Payer: Self-pay | Admitting: *Deleted

## 2023-10-06 ENCOUNTER — Telehealth: Payer: Self-pay | Admitting: *Deleted

## 2023-10-06 NOTE — Telephone Encounter (Signed)
 Dentist -[facility]. Darina called to say that the patient has lot of issues with teeth. She needs several fillings and several decay teeth that would have to come out. She states that she will have sedation for some of this. The dentist wants ok that they can do all of this. They need call back as soon as possible telephone is [phone] and ask for [facility]

## 2023-11-01 ENCOUNTER — Encounter: Payer: Self-pay | Admitting: Internal Medicine

## 2023-11-01 ENCOUNTER — Inpatient Hospital Stay: Payer: Medicare Other | Attending: Internal Medicine

## 2023-11-01 ENCOUNTER — Inpatient Hospital Stay: Payer: Medicare Other | Admitting: Internal Medicine

## 2023-11-01 VITALS — BP 133/79 | HR 100 | Temp 98.0°F | Resp 18 | Ht 64.0 in | Wt 201.0 lb

## 2023-11-01 DIAGNOSIS — E039 Hypothyroidism, unspecified: Secondary | ICD-10-CM | POA: Diagnosis not present

## 2023-11-01 DIAGNOSIS — K921 Melena: Secondary | ICD-10-CM | POA: Diagnosis not present

## 2023-11-01 DIAGNOSIS — C210 Malignant neoplasm of anus, unspecified: Secondary | ICD-10-CM

## 2023-11-01 DIAGNOSIS — E559 Vitamin D deficiency, unspecified: Secondary | ICD-10-CM | POA: Insufficient documentation

## 2023-11-01 DIAGNOSIS — I10 Essential (primary) hypertension: Secondary | ICD-10-CM | POA: Diagnosis not present

## 2023-11-01 DIAGNOSIS — G4700 Insomnia, unspecified: Secondary | ICD-10-CM | POA: Insufficient documentation

## 2023-11-01 DIAGNOSIS — Z85048 Personal history of other malignant neoplasm of rectum, rectosigmoid junction, and anus: Secondary | ICD-10-CM | POA: Insufficient documentation

## 2023-11-01 DIAGNOSIS — Z803 Family history of malignant neoplasm of breast: Secondary | ICD-10-CM | POA: Diagnosis not present

## 2023-11-01 LAB — CMP (CANCER CENTER ONLY)
ALT: 22 U/L (ref 0–44)
AST: 30 U/L (ref 15–41)
Albumin: 4.1 g/dL (ref 3.5–5.0)
Alkaline Phosphatase: 79 U/L (ref 38–126)
Anion gap: 10 (ref 5–15)
BUN: 13 mg/dL (ref 8–23)
CO2: 25 mmol/L (ref 22–32)
Calcium: 9.4 mg/dL (ref 8.9–10.3)
Chloride: 101 mmol/L (ref 98–111)
Creatinine: 0.95 mg/dL (ref 0.44–1.00)
GFR, Estimated: >60 mL/min (ref >60)
Glucose, Bld: 139 mg/dL — ABNORMAL HIGH (ref 70–99)
Potassium: 3.6 mmol/L (ref 3.5–5.1)
Sodium: 136 mmol/L (ref 135–145)
Total Bilirubin: 0.9 mg/dL (ref 0.0–1.2)
Total Protein: 7.5 g/dL (ref 6.5–8.1)

## 2023-11-01 LAB — CBC WITH DIFFERENTIAL (CANCER CENTER ONLY)
Abs Immature Granulocytes: 0.03 10*3/uL (ref 0.00–0.07)
Basophils Absolute: 0.1 10*3/uL (ref 0.0–0.1)
Basophils Relative: 1 %
Eosinophils Absolute: 0.1 10*3/uL (ref 0.0–0.5)
Eosinophils Relative: 1 %
HCT: 39.4 % (ref 36.0–46.0)
Hemoglobin: 13.3 g/dL (ref 12.0–15.0)
Immature Granulocytes: 0 %
Lymphocytes Relative: 18 %
Lymphs Abs: 1.3 10*3/uL (ref 0.7–4.0)
MCH: 32.2 pg (ref 26.0–34.0)
MCHC: 33.8 g/dL (ref 30.0–36.0)
MCV: 95.4 fL (ref 80.0–100.0)
Monocytes Absolute: 0.5 10*3/uL (ref 0.1–1.0)
Monocytes Relative: 7 %
Neutro Abs: 5.5 10*3/uL (ref 1.7–7.7)
Neutrophils Relative %: 73 %
Platelet Count: 301 10*3/uL (ref 150–400)
RBC: 4.13 MIL/uL (ref 3.87–5.11)
RDW: 12.3 % (ref 11.5–15.5)
WBC Count: 7.6 10*3/uL (ref 4.0–10.5)
nRBC: 0 % (ref 0.0–0.2)

## 2023-11-01 NOTE — Assessment & Plan Note (Signed)
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  MARCH 8th, 2022- PET - Nonspecific residual mild anal hypermetabolism, decreased, which could represent post treatment change or residual malignancy. No hypermetabolic locoregional or distant metastatic disease. SEP 2023- - s/p anoscopy- [Dr.[name]]-no evidence of any malignancy.  Internal hemorrhoids without evidence of bleeding. No evidence of recurrent cancer. NOV 2024- CT CAP- NEGATIVE.   #  again reminded the patient re: colo/sigmoidoscopy [in [place]]. Patient is due for an annual surveillance imaging.  Ordered CT scan chest and pelvis for 6 months.   # Intermittent blood in stools-likely hemorrhoidal- s/p evaluation with re: colo/sigmoidoscopy [in [place]].  # Chronic insomnia- sec to pain/MSK-?/metallic implants in LE-  ok with voltaren gel.   # vit D Def [PCP]- on vitD supp. On 50K/weekly- recommend follow up PCP re: repeat Vit D levels    # DISPOSITION: print copy of labs from today # follow up in 6 months - MD: labs;cbc/cmp; [facility] - Dr.B

## 2023-11-01 NOTE — Progress Notes (Signed)
 Is it ok for her to use voltren gel for pain? Concerned with liver levels.  Asking if she taking too much vit D? Taking 50,000 units weekly by PCP.
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
## 2024-04-24 ENCOUNTER — Other Ambulatory Visit: Payer: Self-pay | Admitting: Internal Medicine

## 2024-04-24 DIAGNOSIS — Z1231 Encounter for screening mammogram for malignant neoplasm of breast: Secondary | ICD-10-CM

## 2024-05-01 ENCOUNTER — Ambulatory Visit
Admission: RE | Admit: 2024-05-01 | Discharge: 2024-05-01 | Disposition: A | Discharge status: Home / Self Care | Source: Ambulatory Visit | Attending: Internal Medicine | Admitting: Internal Medicine

## 2024-05-01 DIAGNOSIS — Z1231 Encounter for screening mammogram for malignant neoplasm of breast: Secondary | ICD-10-CM

## 2024-05-03 ENCOUNTER — Ambulatory Visit
Admission: RE | Admit: 2024-05-03 | Discharge: 2024-05-03 | Disposition: A | Discharge status: Home / Self Care | Source: Ambulatory Visit | Attending: Internal Medicine | Admitting: Internal Medicine

## 2024-05-03 DIAGNOSIS — C210 Malignant neoplasm of anus, unspecified: Secondary | ICD-10-CM | POA: Insufficient documentation

## 2024-05-03 MED ORDER — BARIUM SULFATE 2 % PO SUSP
450.0000 mL | Freq: Once | ORAL | Status: AC
  Administered 2024-05-03: 450 mL via ORAL

## 2024-05-03 MED ORDER — IOHEXOL 300 MG/ML  SOLN
100.0000 mL | Freq: Once | INTRAMUSCULAR | Status: AC | PRN
  Administered 2024-05-03: 100 mL via INTRAVENOUS

## 2024-05-16 ENCOUNTER — Inpatient Hospital Stay: Admitting: Internal Medicine

## 2024-05-16 ENCOUNTER — Encounter: Payer: Self-pay | Admitting: Internal Medicine

## 2024-05-16 ENCOUNTER — Inpatient Hospital Stay: Attending: Internal Medicine

## 2024-05-16 VITALS — BP 144/79 | HR 95 | Temp 99.0°F | Resp 20 | Ht 64.0 in | Wt 205.4 lb

## 2024-05-16 DIAGNOSIS — Z85048 Personal history of other malignant neoplasm of rectum, rectosigmoid junction, and anus: Secondary | ICD-10-CM | POA: Diagnosis present

## 2024-05-16 DIAGNOSIS — C210 Malignant neoplasm of anus, unspecified: Secondary | ICD-10-CM

## 2024-05-16 DIAGNOSIS — K921 Melena: Secondary | ICD-10-CM | POA: Diagnosis not present

## 2024-05-16 DIAGNOSIS — I700 Atherosclerosis of aorta: Secondary | ICD-10-CM | POA: Diagnosis not present

## 2024-05-16 DIAGNOSIS — I10 Essential (primary) hypertension: Secondary | ICD-10-CM | POA: Diagnosis not present

## 2024-05-16 LAB — CBC WITH DIFFERENTIAL (CANCER CENTER ONLY)
Abs Immature Granulocytes: 0.04 K/uL (ref 0.00–0.07)
Basophils Absolute: 0.1 K/uL (ref 0.0–0.1)
Basophils Relative: 1 %
Eosinophils Absolute: 0.1 K/uL (ref 0.0–0.5)
Eosinophils Relative: 1 %
HCT: 36.6 % (ref 36.0–46.0)
Hemoglobin: 12.4 g/dL (ref 12.0–15.0)
Immature Granulocytes: 1 %
Lymphocytes Relative: 17 %
Lymphs Abs: 1.3 K/uL (ref 0.7–4.0)
MCH: 32.5 pg (ref 26.0–34.0)
MCHC: 33.9 g/dL (ref 30.0–36.0)
MCV: 95.8 fL (ref 80.0–100.0)
Monocytes Absolute: 0.7 K/uL (ref 0.1–1.0)
Monocytes Relative: 9 %
Neutro Abs: 5.7 K/uL (ref 1.7–7.7)
Neutrophils Relative %: 71 %
Platelet Count: 291 K/uL (ref 150–400)
RBC: 3.82 MIL/uL — ABNORMAL LOW (ref 3.87–5.11)
RDW: 12.4 % (ref 11.5–15.5)
WBC Count: 7.8 K/uL (ref 4.0–10.5)
nRBC: 0 % (ref 0.0–0.2)

## 2024-05-16 LAB — CMP (CANCER CENTER ONLY)
ALT: 23 U/L (ref 0–44)
AST: 34 U/L (ref 15–41)
Albumin: 4 g/dL (ref 3.5–5.0)
Alkaline Phosphatase: 76 U/L (ref 38–126)
Anion gap: 9 (ref 5–15)
BUN: 14 mg/dL (ref 8–23)
CO2: 24 mmol/L (ref 22–32)
Calcium: 9.2 mg/dL (ref 8.9–10.3)
Chloride: 99 mmol/L (ref 98–111)
Creatinine: 0.98 mg/dL (ref 0.44–1.00)
GFR, Estimated: >60 mL/min (ref >60)
Glucose, Bld: 117 mg/dL — ABNORMAL HIGH (ref 70–99)
Potassium: 3.3 mmol/L — ABNORMAL LOW (ref 3.5–5.1)
Sodium: 132 mmol/L — ABNORMAL LOW (ref 135–145)
Total Bilirubin: 0.8 mg/dL (ref 0.0–1.2)
Total Protein: 7.4 g/dL (ref 6.5–8.1)

## 2024-05-16 NOTE — Progress Notes (Signed)
 CT abd/pelvis 05/03/24. Mammogram 05/01/24.  Pt concerned with taking high dose vit d weekly with hx of aortic calcifications. And concerns with heart rate staying elevated. PCP told her she could have a heart attack but hasn't placed her on any rx. She would like referral to cariology if you think it is necessary.
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
## 2024-11-20 ENCOUNTER — Ambulatory Visit: Admitting: Internal Medicine

## 2024-11-20 ENCOUNTER — Other Ambulatory Visit
